# Patient Record
Sex: Male | Born: 1980
Health system: Southern US, Community
[De-identification: ages and names within clinical notes are randomized; demographics above are authoritative.]

## PROBLEM LIST (undated history)

## (undated) DIAGNOSIS — F959 Tic disorder, unspecified: Secondary | ICD-10-CM

## (undated) DIAGNOSIS — F988 Other specified behavioral and emotional disorders with onset usually occurring in childhood and adolescence: Secondary | ICD-10-CM

## (undated) HISTORY — DX: Tic disorder, unspecified: F95.9

## (undated) HISTORY — PX: MRI: SHX5353

## (undated) HISTORY — PX: HERNIA REPAIR: SHX51

## (undated) HISTORY — DX: Other specified behavioral and emotional disorders with onset usually occurring in childhood and adolescence: F98.8

---

## 2002-06-05 HISTORY — PX: TYMPANOSTOMY TUBE PLACEMENT: SHX32

## 2003-06-06 HISTORY — PX: COLONOSCOPY: SHX174

## 2004-06-23 ENCOUNTER — Ambulatory Visit: Payer: Self-pay | Admitting: Internal Medicine

## 2005-06-22 ENCOUNTER — Ambulatory Visit: Payer: Self-pay | Admitting: Internal Medicine

## 2005-06-27 ENCOUNTER — Encounter: Admission: RE | Admit: 2005-06-27 | Discharge: 2005-06-27 | Payer: Self-pay | Admitting: Internal Medicine

## 2005-07-20 ENCOUNTER — Encounter: Admission: RE | Admit: 2005-07-20 | Discharge: 2005-07-20 | Payer: Self-pay | Admitting: General Surgery

## 2005-09-15 ENCOUNTER — Ambulatory Visit: Payer: Self-pay | Admitting: Family Medicine

## 2006-01-31 ENCOUNTER — Ambulatory Visit (HOSPITAL_COMMUNITY): Admission: RE | Admit: 2006-01-31 | Discharge: 2006-01-31 | Payer: Self-pay | Admitting: General Surgery

## 2006-01-31 ENCOUNTER — Encounter (INDEPENDENT_AMBULATORY_CARE_PROVIDER_SITE_OTHER): Payer: Self-pay | Admitting: Specialist

## 2006-05-21 ENCOUNTER — Ambulatory Visit: Payer: Self-pay | Admitting: Internal Medicine

## 2006-11-06 ENCOUNTER — Telehealth (INDEPENDENT_AMBULATORY_CARE_PROVIDER_SITE_OTHER): Payer: Self-pay | Admitting: *Deleted

## 2006-11-08 DIAGNOSIS — F988 Other specified behavioral and emotional disorders with onset usually occurring in childhood and adolescence: Secondary | ICD-10-CM | POA: Insufficient documentation

## 2007-02-11 ENCOUNTER — Telehealth (INDEPENDENT_AMBULATORY_CARE_PROVIDER_SITE_OTHER): Payer: Self-pay | Admitting: *Deleted

## 2007-03-12 ENCOUNTER — Telehealth (INDEPENDENT_AMBULATORY_CARE_PROVIDER_SITE_OTHER): Payer: Self-pay | Admitting: *Deleted

## 2007-04-08 ENCOUNTER — Ambulatory Visit: Payer: Self-pay | Admitting: Internal Medicine

## 2007-04-08 DIAGNOSIS — F959 Tic disorder, unspecified: Secondary | ICD-10-CM

## 2007-04-24 ENCOUNTER — Telehealth (INDEPENDENT_AMBULATORY_CARE_PROVIDER_SITE_OTHER): Payer: Self-pay | Admitting: *Deleted

## 2007-08-06 ENCOUNTER — Telehealth (INDEPENDENT_AMBULATORY_CARE_PROVIDER_SITE_OTHER): Payer: Self-pay | Admitting: *Deleted

## 2007-09-20 ENCOUNTER — Telehealth (INDEPENDENT_AMBULATORY_CARE_PROVIDER_SITE_OTHER): Payer: Self-pay | Admitting: *Deleted

## 2007-10-01 ENCOUNTER — Encounter: Payer: Self-pay | Admitting: Internal Medicine

## 2007-10-01 ENCOUNTER — Telehealth (INDEPENDENT_AMBULATORY_CARE_PROVIDER_SITE_OTHER): Payer: Self-pay | Admitting: *Deleted

## 2007-12-26 ENCOUNTER — Telehealth (INDEPENDENT_AMBULATORY_CARE_PROVIDER_SITE_OTHER): Payer: Self-pay | Admitting: *Deleted

## 2008-03-23 ENCOUNTER — Encounter: Payer: Self-pay | Admitting: Internal Medicine

## 2008-04-06 ENCOUNTER — Ambulatory Visit: Payer: Self-pay | Admitting: Internal Medicine

## 2008-07-21 ENCOUNTER — Telehealth (INDEPENDENT_AMBULATORY_CARE_PROVIDER_SITE_OTHER): Payer: Self-pay | Admitting: *Deleted

## 2008-10-30 ENCOUNTER — Telehealth (INDEPENDENT_AMBULATORY_CARE_PROVIDER_SITE_OTHER): Payer: Self-pay | Admitting: *Deleted

## 2008-12-02 ENCOUNTER — Ambulatory Visit: Payer: Self-pay | Admitting: Internal Medicine

## 2009-01-25 ENCOUNTER — Telehealth (INDEPENDENT_AMBULATORY_CARE_PROVIDER_SITE_OTHER): Payer: Self-pay | Admitting: *Deleted

## 2009-04-19 ENCOUNTER — Ambulatory Visit: Payer: Self-pay | Admitting: Internal Medicine

## 2009-07-13 ENCOUNTER — Telehealth (INDEPENDENT_AMBULATORY_CARE_PROVIDER_SITE_OTHER): Payer: Self-pay | Admitting: *Deleted

## 2009-07-30 ENCOUNTER — Encounter (INDEPENDENT_AMBULATORY_CARE_PROVIDER_SITE_OTHER): Payer: Self-pay | Admitting: *Deleted

## 2009-07-30 ENCOUNTER — Ambulatory Visit: Payer: Self-pay | Admitting: Internal Medicine

## 2009-10-07 ENCOUNTER — Telehealth (INDEPENDENT_AMBULATORY_CARE_PROVIDER_SITE_OTHER): Payer: Self-pay | Admitting: *Deleted

## 2009-10-08 ENCOUNTER — Encounter: Payer: Self-pay | Admitting: Internal Medicine

## 2009-12-30 ENCOUNTER — Telehealth: Payer: Self-pay | Admitting: Internal Medicine

## 2010-01-05 ENCOUNTER — Ambulatory Visit: Payer: Self-pay | Admitting: Internal Medicine

## 2010-04-18 ENCOUNTER — Telehealth: Payer: Self-pay | Admitting: Internal Medicine

## 2010-07-05 NOTE — Assessment & Plan Note (Signed)
Summary: followup on adderall//kn   Vital Signs:  Patient profile:   30 year old male Height:      73 inches Weight:      255.13 pounds BMI:     33.78 Pulse rate:   88 / minute Pulse rhythm:   regular BP sitting:   118 / 84  (left arm) Cuff size:   large  Vitals Entered By: Army Fossa CMA (January 05, 2010 11:16 AM) CC: Pt here to f/u on Adderall, not fasting   History of Present Illness: next refill on Adderall Take it as needed, once daily. Symptoms well controlled   Current Medications (verified): 1)  Adderall Xr 10 Mg Cp24 (Amphetamine-Dextroamphetamine) .... Two P.o. Daily  Allergies: 1)  ! Amoxicillin 2)  ! Advil  Past History:  Past Medical History: Reviewed history from 04/06/2008 and no changes required. Chronic Tic Disorder ADD  Past Surgical History: Reviewed history from 04/03/2007 and no changes required. 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 Psych:  Denies anxiety and depression; sleeps well .  Physical Exam  General:  alert and well-developed.   Lungs:  normal respiratory effort, no intercostal retractions, no accessory muscle use, and normal breath sounds.   Heart:  normal rate, regular rhythm, and no murmur.   Psych:  Cognition and judgment appear intact. Alert and cooperative with normal attention span and concentration.  not anxious appearing and not depressed appearing.     Impression & Recommendations:  Problem # 1:  ADD (ICD-314.00) doing well Refill medicines Call for more refills as needed  Problem # 2:  f/u 1 year, rec also a CPX at his convinience  Complete Medication List: 1)  Adderall Xr 10 Mg Cp24 (Amphetamine-dextroamphetamine)  .... Two by mouth once daily as needed  Anticoagulation Management Assessment/Plan:            Patient Instructions: 1)  Please schedule a follow-up appointment in 1 year.  Prescriptions: ADDERALL XR 10 MG CP24 (AMPHETAMINE-DEXTROAMPHETAMINE) two by mouth once daily as needed  #60 x 0   Entered and Authorized by:   Elita Quick E. Paz MD   Signed by:   Nolon Rod. Paz MD on 01/05/2010   Method used:   Print then Give to Patient   RxID:   [id number]

## 2010-07-05 NOTE — Medication Information (Signed)
Summary: Prior Authorization & Approval for Adderall/[employer]  Prior Authorization & Approval for Adderall/[employer]   Imported By: Lanelle Bal 10/13/2009 12:59:03  _____________________________________________________________________  External Attachment:    Type:   Image     Comment:   External Document

## 2010-07-05 NOTE — Letter (Signed)
Summary: [facility] No Show Letter  [facility]  [address] [place], [place] [address]   Phone: [phone]  Fax: [phone]    07/30/2009 MRN: [id number]  Bertie Saetern [address] [place], [place]  [address]   Dear Mr. Haworth,   Our records indicate that you missed your scheduled appointment with ______Dr.Paz____________ on _____2/25/11_______.  Please contact this office to reschedule your appointment as soon as possible.  It is important that you keep your scheduled appointments with your physician, so we can provide you the best care possible.  Please be advised that there may be a charge for "no show" appointments.    Sincerely,   [facility]

## 2010-07-05 NOTE — Progress Notes (Signed)
Summary: Adderall  Phone Note Refill Request Message from:  Patient on April 18, 2010 4:36 PM  Refills Requested: Medication #1:  ADDERALL XR 10 MG CP24 two by mouth once daily as needed. I advised patient of the shortage and he will check with his pharmacy. He notes that he has previously tried Ritalin and did not do well with it.  Initial call taken by: Lucious Groves CMA,  April 18, 2010 4:36 PM  Follow-up for Phone Call        Patient notes that his pharmacy has #60 of the XR dose. Ok to refill? Lucious Groves CMA  April 18, 2010 4:41 PM   Additional Follow-up for Phone Call Additional follow up Details #1::        ok 60, no RF Jose E. Paz MD  April 18, 2010 5:15 PM     Additional Follow-up for Phone Call Additional follow up Details #2::    Patient was made aware yesterday that he can pick up the prescription after lunch today. Follow-up by: Lucious Groves CMA,  April 19, 2010 8:20 AM  Prescriptions: ADDERALL XR 10 MG CP24 (AMPHETAMINE-DEXTROAMPHETAMINE) two by mouth once daily as needed  #60 x 0   Entered by:   Lucious Groves CMA   Authorized by:   Nolon Rod. Paz MD   Signed by:   Lucious Groves CMA on 04/19/2010   Method used:   Print then Give to Patient   RxID:   [id number]

## 2010-07-05 NOTE — Progress Notes (Signed)
Summary: adderall refill  Phone Note Refill Request Call back at Work Phone [phone] Message from:  Patient on July 13, 2009 1:29 PM  Refills Requested: Medication #1:  ADDERALL XR 10 MG CP24 two p.o. daily Initial call taken by: Doristine Devoid,  July 13, 2009 1:29 PM  Follow-up for Phone Call        has pending appt on 07/30/09 and left msg prescription ready for pick up.........Marland KitchenDoristine Devoid  July 13, 2009 1:29 PM     Prescriptions: ADDERALL XR 10 MG CP24 (AMPHETAMINE-DEXTROAMPHETAMINE) two p.o. daily  #60 x 0   Entered by:   Doristine Devoid   Authorized by:   Nolon Rod. Paz MD   Signed by:   Doristine Devoid on 07/13/2009   Method used:   Print then Give to Patient   RxID:   [id number]

## 2010-07-05 NOTE — Progress Notes (Signed)
Summary: Adderall  Phone Note Refill Request Message from:  Patient on December 30, 2009 3:18 PM  Refills Requested: Medication #1:  ADDERALL XR 10 MG CP24 two p.o. daily patient will pick up [id number] after 12:00  Initial call taken by: Okey Regal Spring,  December 30, 2009 3:19 PM  Follow-up for Phone Call        please advise. Lucious Groves CMA  December 30, 2009 3:36 PM   Additional Follow-up for Phone Call Additional follow up Details #1::        no refill at this time.  pt has not seen Dr Drue Novel in >1 yr.  needs OV prior to refill Additional Follow-up by: Neena Rhymes MD,  December 30, 2009 3:40 PM    Additional Follow-up for Phone Call Additional follow up Details #2::    left message on voicemail to call back to office. Lucious Groves CMA  December 30, 2009 3:51 PM   [email or web address].Marland KitchenMarland KitchenHarold Barban  January 04, 2010 1:06 PM  Closed phone note, patient has appt today. Lucious Groves CMA  January 05, 2010 4:37 PM

## 2010-07-05 NOTE — Progress Notes (Signed)
Summary: prior auth iAPPROVED  for Adderall  Phone Note Refill Request Message from:  Fax from Pharmacy on Oct 07, 2009 2:00 PM  Refills Requested: Medication #1:  ADDERALL XR 10 MG CP24 two p.o. daily prior auth  request pt id [id number]--- ph [phone] ins [facility]   Initial call taken by: Okey Regal Spring,  Oct 07, 2009 2:01 PM  Follow-up for Phone Call        Prior auth in process for Adderall/[employer]  [phone] .Kandice Hams  Oct 08, 2009 8:32 AM   Additional Follow-up for Phone Call Additional follow up Details #1::        Prior auth approved for Adderall until 10/08/2010 .Kandice Hams  Oct 08, 2009 11:19 AM

## 2010-07-21 ENCOUNTER — Telehealth: Payer: Self-pay | Admitting: Internal Medicine

## 2010-07-27 NOTE — Progress Notes (Signed)
Summary: Refill--Adderall  Phone Note Refill Request Call back at Work Phone [phone] Message from:  Patient on July 21, 2010 2:08 PM  Refills Requested: Medication #1:  ADDERALL XR 10 MG CP24 two by mouth once daily as needed. Patient aware prescription will be ready after lunch tomorrow.  Initial call taken by: Lucious Groves CMA,  July 21, 2010 2:08 PM    Prescriptions: ADDERALL XR 10 MG CP24 (AMPHETAMINE-DEXTROAMPHETAMINE) two by mouth once daily as needed  #60 x 0   Entered by:   Lucious Groves CMA   Authorized by:   Nolon Rod. Audie Wieser MD   Signed by:   Lucious Groves CMA on 07/21/2010   Method used:   Print then Give to Patient   RxID:   [id number]

## 2010-10-21 NOTE — Op Note (Signed)
NAME:  David Buckley, David Buckley              ACCOUNT NO.:  [id number]   MEDICAL RECORD NO.:  [id number]          PATIENT TYPE:  AMB   LOCATION:  DAY                          FACILITY:  [facility]   PHYSICIAN:  Ollen Gross. Vernell Morgans, M.D. DATE OF BIRTH:  09-07-1980   DATE OF PROCEDURE:  01/31/2006  DATE OF DISCHARGE:  01/31/2006                                 OPERATIVE REPORT   PREOPERATIVE DIAGNOSIS:  Left inguinal hernia.   POSTOPERATIVE DIAGNOSIS:  Indirect left inguinal hernia.   PROCEDURE:  Left inguinal hernia repair with mesh.   SURGEON:  Ollen Gross. Carolynne Edouard, M.D.   ANESTHESIA:  General.   PROCEDURE:  After informed consent was obtained, the patient was brought to  the operating room and placed in the supine position on the operating table.  After adequate induction of general anesthesia, the patient's left groin and  abdomen were prepped with Betadine and draped in the usual sterile manner.  The area of the left groin was then infiltrated with 0.25% Marcaine with  epinephrine.  A small incision was made from the edge of the pubic tubercle  on the left towards the anterosuperior iliac spine for a distance of about 5  cm.  This incision was carried down through the skin and subcutaneous tissue  sharply with the electrocautery.  A small bridging vein was encountered that  was clamped with hemostats, divided and ligated with 3-0 silk ties.  The  rest of the dissection was carried sharply through the subcutaneous tissue  with electrocautery until the fascia of the external oblique was  encountered.  The fascia of the external oblique was opened along its fibers  using a 15 blade knife and Metzenbaum scissors towards the apex of the  external ring.  A Weitlaner retractor was deployed.  The cord structures  were then dissected bluntly with finger dissection at the edge of the pubic  tubercle until the cord structures could be surrounded between 2 fingers.  A  half-inch Penrose drain was then placed  around the cord structures for  retraction purposes.  The floor of the canal appeared to be intact.  The  cord structures were gently skeletonized by a combination of blunt hemostat  dissection and sharp dissection with the electrocautery.  An indirect hernia  sac was identified.  The sac appeared to be more of a sliding hernia and the  contents would not fully reduce out of the sac; therefore, the sac was  reduced and the opening around the cord was repaired with the 0 Vicryl  suture.  Once this was accomplished, a piece of 3 x 6 ULTRAPRO mesh was  chosen and cut to fit and the mesh was sewn inferiorly to the shelving edge  of inguinal ligament with a running 2-0 Prolene stitch.  Tails were cut in  the mesh and the tails of mesh were wrapped laterally around the cord  structures.  Superiorly, the mesh was sewn to the musculoaponeurotic  strength layer of the transversalis with interrupted 2-0 Prolene vertical  mattress stitches.  The tails were anchored laterally with an interrupted 2-  0  Prolene stitch to the shelving edge of the inguinal ligament.  Once this  was accomplished, the mesh was in good position without any tension.  The  wound was irrigated with copious amounts of saline.  The external oblique  was then reapproximated with a running 2-0 Vicryl stitch.  The wound was  then infiltrated with more 0.25% Marcaine with epinephrine.  The  subcutaneous fascia was closed with a running 3-0 Vicryl stitch and the skin  was closed with a running 4-0 Monocryl subcuticular stitch.  Benzoin, Steri-  Strips and sterile dressings were applied.  The patient's testicle was in the scrotum at the end of the case.  The  patient tolerated well.  At the end of the case, all needle, sponge and  instrument counts were correct.  The patient was then awakened and taken to  the recovery room in stable condition.      Ollen Gross. Vernell Morgans, M.D.  Electronically Signed     PST/MEDQ  D:  02/06/2006  T:   02/06/2006  Job:  [id number]

## 2010-10-24 ENCOUNTER — Other Ambulatory Visit: Payer: Self-pay

## 2010-10-24 MED ORDER — AMPHETAMINE-DEXTROAMPHET ER 10 MG PO CP24: 10.0000 mg | ORAL_CAPSULE | Freq: Every day | ORAL | Status: DC

## 2010-10-24 NOTE — Telephone Encounter (Signed)
Pt aware that office is due before next refill.

## 2010-12-28 ENCOUNTER — Other Ambulatory Visit: Payer: Self-pay

## 2010-12-28 MED ORDER — AMPHETAMINE-DEXTROAMPHET ER 10 MG PO CP24: 10.0000 mg | ORAL_CAPSULE | Freq: Every day | ORAL | Status: DC

## 2010-12-28 NOTE — Telephone Encounter (Signed)
RX at the front for pick-up, patient aware appointment due 01/2011

## 2011-01-24 ENCOUNTER — Telehealth: Payer: Self-pay | Admitting: *Deleted

## 2011-01-24 ENCOUNTER — Encounter: Payer: Self-pay | Admitting: *Deleted

## 2011-01-24 ENCOUNTER — Ambulatory Visit (INDEPENDENT_AMBULATORY_CARE_PROVIDER_SITE_OTHER): Payer: 59 | Admitting: Internal Medicine

## 2011-01-24 VITALS — BP 122/70 | HR 75 | Temp 98.7°F | Resp 14 | Wt 263.5 lb

## 2011-01-24 DIAGNOSIS — F988 Other specified behavioral and emotional disorders with onset usually occurring in childhood and adolescence: Secondary | ICD-10-CM

## 2011-01-24 DIAGNOSIS — E669 Obesity, unspecified: Secondary | ICD-10-CM

## 2011-01-24 DIAGNOSIS — M79606 Pain in leg, unspecified: Secondary | ICD-10-CM

## 2011-01-24 DIAGNOSIS — F959 Tic disorder, unspecified: Secondary | ICD-10-CM

## 2011-01-24 DIAGNOSIS — M79609 Pain in unspecified limb: Secondary | ICD-10-CM

## 2011-01-24 LAB — BASIC METABOLIC PANEL
BUN: 17 mg/dL (ref 6–23)
CO2: 30 mEq/L (ref 19–32)
Creatinine, Ser: 0.9 mg/dL (ref 0.4–1.5)
GFR: 106.99 mL/min (ref >60.00)
Glucose, Bld: 91 mg/dL (ref 70–99)

## 2011-01-24 LAB — TSH: TSH: 1.94 u[IU]/mL (ref 0.35–5.50)

## 2011-01-24 MED ORDER — AMPHETAMINE-DEXTROAMPHET ER 10 MG PO CP24: 10.0000 mg | ORAL_CAPSULE | Freq: Every day | ORAL | Status: DC

## 2011-01-24 NOTE — Progress Notes (Signed)
  Subjective:    Patient ID: David Buckley, male    DOB: 18-Aug-1980, 30 y.o.   MRN: [id number]  HPI ROV ADHD: Symptoms well controlled with current doses of Adderall Leg pain: Complain of left calf cramp seems he had a back problem a few years ago, he noted that the cramps were more noticeable this week when, incidentally he couldn't do as much exercise as he usually does. Obesity: Elevated BMI, states he's eating 1100 calories a day and runs 2.5 miles 5 times a week, despite that regimen he is unable to lose weight  Past Medical History  Diagnosis Date  . Tic disorder     Chronic  . ADD (attention deficit disorder)    Past Surgical History  Procedure Date  . Mri     L5-S1 problem---recvd nerve root block, denied Sx   History   Social History  . Marital Status: Married    Spouse Name: N/A    Number of Children: 0  . Years of Education: N/A   Occupational History  . commercial real estate      Social History Main Topics  . Smoking status: Never Smoker   . Smokeless tobacco: Not on file  . Alcohol Use: Yes     socially  . Drug Use: No  . Sexually Active: Not on file   Other Topics Concern  . Not on file   Social History Narrative   Works FT w/his fatherWife pregnant again (has miscarriage x 2)Owns a Holiday representative business (PT)    Review of Systems Denies anxiety, depression, insomnia. Denies any lower extremity swelling or rash. No back pain. Some distress, wife had 2 miscarriages, she is pregnant again.    Objective:   Physical Exam  Constitutional: He is oriented to person, place, and time. He appears well-developed and well-nourished.  HENT:  Head: Normocephalic and atraumatic.  Musculoskeletal:       Calves symmetric, no tender   Neurological: He is alert and oriented to person, place, and time. He has normal reflexes. He displays normal reflexes. He exhibits normal muscle tone. Coordination normal.       Straight leg test negative  Skin: Skin is warm  and dry.  Psychiatric: He has a normal mood and affect. His behavior is normal. Judgment and thought content normal.          Assessment & Plan:

## 2011-01-24 NOTE — Assessment & Plan Note (Signed)
Doing well, RF, RTC 6 months

## 2011-01-24 NOTE — Telephone Encounter (Signed)
Same Day Abstraction. 

## 2011-01-24 NOTE — Assessment & Plan Note (Signed)
BMI 34, unable to loose wt despite exercising x 5 /week and eating better Plan: Labs [employer]? Calorie counting

## 2011-01-24 NOTE — Assessment & Plan Note (Signed)
Chronic L calf cramp since back problems few years ago, exam normal rec observation, call if sx increase

## 2011-01-24 NOTE — Assessment & Plan Note (Signed)
Used to see neurology, used to take cylert

## 2011-05-02 ENCOUNTER — Other Ambulatory Visit: Payer: Self-pay | Admitting: Internal Medicine

## 2011-05-04 ENCOUNTER — Other Ambulatory Visit: Payer: Self-pay

## 2011-05-04 MED ORDER — AMPHETAMINE-DEXTROAMPHET ER 10 MG PO CP24: 10.0000 mg | ORAL_CAPSULE | Freq: Every day | ORAL | Status: DC

## 2011-05-04 NOTE — Telephone Encounter (Signed)
Done and pt aware

## 2011-05-04 NOTE — Telephone Encounter (Signed)
Rx printed and ready for pick up. Pt aware

## 2011-07-04 ENCOUNTER — Telehealth: Payer: Self-pay | Admitting: Internal Medicine

## 2011-07-04 MED ORDER — AMPHETAMINE-DEXTROAMPHET ER 10 MG PO CP24: 10.0000 mg | ORAL_CAPSULE | Freq: Every day | ORAL | Status: DC

## 2011-07-04 NOTE — Telephone Encounter (Signed)
Yes please

## 2011-07-04 NOTE — Telephone Encounter (Signed)
Refill request Adderall 10mg . #30 with zero refills. Last refilled on 11.29.13. OK to refill?

## 2011-07-04 NOTE — Telephone Encounter (Signed)
Patient is requesting a new prescription for adderall °

## 2011-07-04 NOTE — Telephone Encounter (Signed)
Refill done.  

## 2011-08-18 ENCOUNTER — Other Ambulatory Visit: Payer: Self-pay | Admitting: *Deleted

## 2011-08-18 MED ORDER — AMPHETAMINE-DEXTROAMPHET ER 10 MG PO CP24: 10.0000 mg | ORAL_CAPSULE | Freq: Every day | ORAL | Status: DC

## 2011-08-18 NOTE — Telephone Encounter (Signed)
Pt aware Rx ready for pick up 

## 2011-08-18 NOTE — Telephone Encounter (Signed)
Office Message [address] [place], [place] [address] p. [phone] f. [phone] To: [facility] (Daytime Triage) Fax: [phone] From: [facility] Date/ Time: 08/18/2011 9:24 AM Taken By: Thayer Headings, RN Caller: Molli Hazard Facility: not collected Patient: David Buckley, David Buckley DOB: 1981-05-06 Phone: [phone] Reason for Call: Pt calling today 08/17/11 regarding needs prescription refill for Adderal XR10 mg QD, says he has to talk with MD's nurse each time to get prescription refilled. PLEASE CALL PT AT [phone] WHEN HE CAN COME BY TO PICK UP PRESCRIPTION. Regarding Appointment: Appt Date: Appt Time: Unknown Provider: Reason: Details: Outcome:

## 2011-10-18 ENCOUNTER — Other Ambulatory Visit: Payer: Self-pay | Admitting: *Deleted

## 2011-10-18 MED ORDER — AMPHETAMINE-DEXTROAMPHET ER 10 MG PO CP24: 10.0000 mg | ORAL_CAPSULE | Freq: Every day | ORAL | Status: DC

## 2011-10-18 NOTE — Telephone Encounter (Signed)
Pt aware 6 month f/u due and Rx ready for pickup, appt scheduled.

## 2011-11-06 ENCOUNTER — Ambulatory Visit (INDEPENDENT_AMBULATORY_CARE_PROVIDER_SITE_OTHER): Payer: 59 | Admitting: Internal Medicine

## 2011-11-06 ENCOUNTER — Encounter: Payer: Self-pay | Admitting: Internal Medicine

## 2011-11-06 VITALS — BP 130/86 | HR 82 | Temp 98.2°F | Wt 267.0 lb

## 2011-11-06 DIAGNOSIS — F988 Other specified behavioral and emotional disorders with onset usually occurring in childhood and adolescence: Secondary | ICD-10-CM

## 2011-11-06 NOTE — Patient Instructions (Signed)
Call for refills as needed  Come back in 6 months Physical exam at your convenience

## 2011-11-06 NOTE — Progress Notes (Signed)
  Subjective:    Patient ID: David Buckley, male    DOB: 04-Mar-1981, 31 y.o.   MRN: [id number]  HPI ROV Doing well, adderall at present dose works well Has a 36 week old baby at home !  Past Medical History  Diagnosis Date  . Tic disorder     Chronic  . ADD (attention deficit disorder)    Past Surgical History  Procedure Date  . Mri     L5-S1 problem---recvd nerve root block, denied Sx     Review of Systems No anxiety depression, sleeps well  No CP, SOB No N V D    Objective:   Physical Exam  General -- alert, well-developed, and overweight appearing. No apparent distress.  Lungs -- normal respiratory effort, no intercostal retractions, no accessory muscle use, and normal breath sounds.   Heart-- normal rate, regular rhythm, no murmur, and no gallop.   Neurologic-- alert & oriented X3 and strength normal in all extremities. Psych-- Cognition and judgment appear intact. Alert and cooperative with normal attention span and concentration.  not anxious appearing and not depressed appearing.       Assessment & Plan:

## 2011-11-06 NOTE — Assessment & Plan Note (Signed)
Doing well RF as needed Recommend to to RTC ~ 6 months, due for CPX

## 2012-01-03 ENCOUNTER — Other Ambulatory Visit: Payer: Self-pay | Admitting: *Deleted

## 2012-01-03 MED ORDER — AMPHETAMINE-DEXTROAMPHET ER 10 MG PO CP24: 10.0000 mg | ORAL_CAPSULE | Freq: Every day | ORAL | Status: DC

## 2012-01-03 NOTE — Telephone Encounter (Signed)
Pt aware Rx ready for pick up 

## 2012-02-21 ENCOUNTER — Telehealth: Payer: Self-pay | Admitting: Internal Medicine

## 2012-02-21 MED ORDER — AMPHETAMINE-DEXTROAMPHET ER 10 MG PO CP24: 10.0000 mg | ORAL_CAPSULE | Freq: Every day | ORAL | Status: DC

## 2012-02-21 NOTE — Telephone Encounter (Signed)
Ok  #30 

## 2012-02-21 NOTE — Telephone Encounter (Signed)
Ok to refill 

## 2012-02-21 NOTE — Telephone Encounter (Signed)
LM ON TRIAGE LINE 341pm needs refill for Adderall CB# [phone]

## 2012-02-21 NOTE — Telephone Encounter (Signed)
Refill done.  

## 2012-05-07 ENCOUNTER — Other Ambulatory Visit: Payer: Self-pay | Admitting: *Deleted

## 2012-05-07 MED ORDER — AMPHETAMINE-DEXTROAMPHET ER 10 MG PO CP24: 10.0000 mg | ORAL_CAPSULE | Freq: Every day | ORAL | Status: DC

## 2012-05-07 NOTE — Telephone Encounter (Signed)
Rx ready for pick up. 

## 2012-05-21 ENCOUNTER — Encounter: Payer: Self-pay | Admitting: Internal Medicine

## 2012-05-21 ENCOUNTER — Ambulatory Visit (INDEPENDENT_AMBULATORY_CARE_PROVIDER_SITE_OTHER): Payer: 59 | Admitting: Internal Medicine

## 2012-05-21 VITALS — BP 122/84 | HR 89 | Temp 97.9°F | Wt 258.0 lb

## 2012-05-21 DIAGNOSIS — B9789 Other viral agents as the cause of diseases classified elsewhere: Secondary | ICD-10-CM

## 2012-05-21 DIAGNOSIS — B349 Viral infection, unspecified: Secondary | ICD-10-CM

## 2012-05-21 DIAGNOSIS — J029 Acute pharyngitis, unspecified: Secondary | ICD-10-CM

## 2012-05-21 LAB — POCT INFLUENZA A/B
Influenza A, POC: NEGATIVE
Influenza B, POC: NEGATIVE

## 2012-05-21 MED ORDER — AZITHROMYCIN 250 MG PO TABS: ORAL_TABLET | ORAL | Status: DC

## 2012-05-21 NOTE — Progress Notes (Signed)
  Subjective:    Patient ID: David Buckley, male    DOB: 1980-10-15, 31 y.o.   MRN: [id number]  HPI Acute visit One-day history of aches, mostly @ his back;  moderate to severe sore throat, a lot of mucus accumulation in the throat, mild sinus congestion.  Past Medical History  Diagnosis Date  . Tic disorder     Chronic  . ADD (attention deficit disorder)    Past Surgical History  Procedure Date  . Mri     L5-S1 problem---recvd nerve root block, denied Sx   History   Social History  . Marital Status: Married    Spouse Name: N/A    Number of Children: 1  . Years of Education: N/A   Occupational History  . commercial real estate      Social History Main Topics  . Smoking status: Never Smoker   . Smokeless tobacco: Not on file  . Alcohol Use: Yes     Comment: socially  . Drug Use: No  . Sexually Active: Not on file   Other Topics Concern  . Not on file   Social History Narrative       Review of Systems No fever chills, very mild cough, no chest congestion. Denies any nausea vomiting, had diarrhea today, denies blood in the stools or abdominal pain.     Objective:   Physical Exam  General -- alert, well-developed , NAD   Neck --no  LADs HEENT -- TMs: I see ear tubes bilaterally, no discharge; throat + redness, tonsils normal size, no discharge; face symmetric and not tender to palpation, mild nasal congestion Lungs -- normal respiratory effort, no intercostal retractions, no accessory muscle use, and normal breath sounds.   Heart-- normal rate, regular rhythm, no murmur, and no gallop.    Psych-- Cognition and judgment appear intact. Alert and cooperative with normal attention span and concentration.  not anxious appearing and not depressed appearing.       Assessment & Plan:  Viral syndrome, Strep and  flu testing negative. See instructions

## 2012-05-21 NOTE — Patient Instructions (Signed)
Rest, fluids , tylenol If  cough, take Mucinex DM twice a day as needed   Take the antibiotic as prescribed  (zithromax) if not improving in the next couple of days Call if no better by next week Call anytime if the symptoms are severe

## 2012-06-10 ENCOUNTER — Encounter: Payer: Self-pay | Admitting: *Deleted

## 2012-06-11 ENCOUNTER — Ambulatory Visit (INDEPENDENT_AMBULATORY_CARE_PROVIDER_SITE_OTHER): Payer: 59 | Admitting: Internal Medicine

## 2012-06-11 ENCOUNTER — Encounter: Payer: Self-pay | Admitting: Internal Medicine

## 2012-06-11 VITALS — BP 132/76 | HR 87 | Temp 97.7°F | Wt 263.0 lb

## 2012-06-11 DIAGNOSIS — F988 Other specified behavioral and emotional disorders with onset usually occurring in childhood and adolescence: Secondary | ICD-10-CM

## 2012-06-11 DIAGNOSIS — J069 Acute upper respiratory infection, unspecified: Secondary | ICD-10-CM

## 2012-06-11 DIAGNOSIS — Z23 Encounter for immunization: Secondary | ICD-10-CM

## 2012-06-11 MED ORDER — AMPHETAMINE-DEXTROAMPHET ER 10 MG PO CP24: 10.0000 mg | ORAL_CAPSULE | Freq: Every day | ORAL | Status: DC

## 2012-06-11 NOTE — Progress Notes (Signed)
  Subjective:    Patient ID: David Buckley, male    DOB: 11-Jul-1980, 32 y.o.   MRN: [id number]  HPI Routine office visit ADD, needs a refill, we also need to call his insurance company, see assessment and plan. Respiratory symptoms, was seen last month with a viral syndrome, he ended up taking a Z-Pak by December 30, got well up until 5 days ago when he developed sinus congestion, clear nasal discharge.  Past Medical History  Diagnosis Date  . Tic disorder     Chronic  . ADD (attention deficit disorder)    Past Surgical History  Procedure Date  . Mri     L5-S1 problem---recvd nerve root block, denied Sx   History   Social History  . Marital Status: Married    Spouse Name: N/A    Number of Children: 1  . Years of Education: N/A   Occupational History  . commercial real estate      Social History Main Topics  . Smoking status: Never Smoker   . Smokeless tobacco: Not on file  . Alcohol Use: Yes     Comment: socially  . Drug Use: No  . Sexually Active: Not on file   Other Topics Concern  . Not on file   Social History Narrative      Review of Systems Currently without fever, chills, nausea, vomiting. No myalgias. No actual cough or chest congestion.    Objective:   Physical Exam  General -- alert, well-developed  HEENT -- TMs normal, has B ear tubes; throat w/o redness, face symmetric and not tender to palpation, nose congested  Lungs -- normal respiratory effort, no intercostal retractions, no accessory muscle use, and normal breath sounds.   Heart-- normal rate, regular rhythm, no murmur, and no gallop.    Psych-- Cognition and judgment appear intact. Alert and cooperative with normal attention span and concentration.  not anxious appearing and not depressed appearing.      Assessment & Plan:   URI, Was seen with a viral syndrome last month, now w/ respiratory symptoms for 5 days, likely a URI. His daughter has the same symptoms. Plan: see instructions

## 2012-06-11 NOTE — Patient Instructions (Addendum)
Qnsal 2 sprays on each side of the nose until sample gone mucinex twice a day until better Call if nor back to normal in few days  --- Next visit in 6 months, physical exam

## 2012-06-11 NOTE — Assessment & Plan Note (Signed)
Diagnosed with ADD in elementary school, his insurance company is requesting a phone call from prior resection. Currently on Adderall XR, symptoms well-controlled, no apparent side effects. In the past, he tried ritalin but it caused tics In the past he also tried plain Adderall and another med, eventually was switch to adderall XR and doing well.

## 2012-06-13 ENCOUNTER — Telehealth: Payer: Self-pay | Admitting: Internal Medicine

## 2012-06-13 ENCOUNTER — Encounter: Payer: Self-pay | Admitting: Internal Medicine

## 2012-06-13 MED ORDER — AZITHROMYCIN 250 MG PO TABS: ORAL_TABLET | ORAL | Status: DC

## 2012-06-13 NOTE — Telephone Encounter (Signed)
Re-send the Zithromax Rx to his pharmacy

## 2012-06-13 NOTE — Telephone Encounter (Signed)
Pt called stating that since Tuesday when he came in for an OV & was diagnosed with a URI pt states he is much worse, coughing more, still congested, & unable to sleep at night. Pt states that he was prescribed a z-pak but he did not have it filled & he's not sure where the prescription is. Pt would like another antibiotic called into [facility]. Please advise.

## 2012-06-13 NOTE — Telephone Encounter (Signed)
Sent zpak to pharmacy, pt made aware.

## 2012-09-02 ENCOUNTER — Telehealth: Payer: Self-pay | Admitting: *Deleted

## 2012-09-02 MED ORDER — AMPHETAMINE-DEXTROAMPHET ER 10 MG PO CP24: 10.0000 mg | ORAL_CAPSULE | Freq: Every day | ORAL | Status: DC

## 2012-09-02 NOTE — Telephone Encounter (Signed)
done

## 2012-09-02 NOTE — Telephone Encounter (Signed)
Pt made aware rx is ready to be picked up at front desk.  

## 2012-09-02 NOTE — Telephone Encounter (Signed)
Patient left message on triage requesting refill on Adderall. Last OV and refill both on 06/11/12 with #30 prescribed. Okay to refill?

## 2012-11-27 ENCOUNTER — Telehealth: Payer: Self-pay | Admitting: *Deleted

## 2012-11-27 MED ORDER — AMPHETAMINE-DEXTROAMPHET ER 10 MG PO CP24: 10.0000 mg | ORAL_CAPSULE | Freq: Every day | ORAL | Status: DC

## 2012-11-27 NOTE — Telephone Encounter (Signed)
Last OV 06-11-12, Last refilled 09-02-12 #30

## 2012-11-27 NOTE — Telephone Encounter (Signed)
Which med?

## 2012-11-27 NOTE — Telephone Encounter (Signed)
Ok for #30, no refills 

## 2012-11-27 NOTE — Telephone Encounter (Signed)
Pt aware Rx ready for pick up 

## 2012-11-27 NOTE — Telephone Encounter (Signed)
adderall

## 2012-12-07 ENCOUNTER — Ambulatory Visit (INDEPENDENT_AMBULATORY_CARE_PROVIDER_SITE_OTHER): Payer: 59 | Admitting: Family Medicine

## 2012-12-07 VITALS — BP 130/82 | HR 78 | Temp 98.0°F | Resp 18 | Ht 73.0 in | Wt 259.0 lb

## 2012-12-07 DIAGNOSIS — H6690 Otitis media, unspecified, unspecified ear: Secondary | ICD-10-CM

## 2012-12-07 DIAGNOSIS — H6692 Otitis media, unspecified, left ear: Secondary | ICD-10-CM

## 2012-12-07 DIAGNOSIS — H109 Unspecified conjunctivitis: Secondary | ICD-10-CM

## 2012-12-07 MED ORDER — OFLOXACIN 0.3 % OP SOLN: 1.0000 [drp] | Freq: Four times a day (QID) | OPHTHALMIC | Status: DC

## 2012-12-07 MED ORDER — AMOXICILLIN 500 MG PO TABS: 500.0000 mg | ORAL_TABLET | Freq: Two times a day (BID) | ORAL | Status: DC

## 2012-12-07 NOTE — Progress Notes (Signed)
[facility]:  Office Visit  Chief Complaint:  Chief Complaint  Patient presents with  . pink eye and ear pain    HPI: David Buckley is a 32 y.o. male who complains of  Last week sxs started after his daughter came home with pink eye. She also had ear infection and runny nose. He now has all those sxs. No fever or chills. He has tubes in his ear since 2005. No sore throat or facial pain. He tried erythromycin ointment in his eyes with some releif, this is his daughters, She is totally better after getting treated. The erythromycin make his eyes feels better, he has some bluured vision because of the ointment other than that the redness has improved with it. He deneis eye pain or swelling. Deneis allergies. Has been moving and has been exposed to dust but this was later than when his ear paina nd pink eye started. Now it is in both eyes. .   Past Medical History  Diagnosis Date  . Tic disorder     Chronic  . ADD (attention deficit disorder)    Past Surgical History  Procedure Laterality Date  . Mri      L5-S1 problem---recvd nerve root block, denied Sx  . Hernia repair     History   Social History  . Marital Status: Married    Spouse Name: N/A    Number of Children: 1  . Years of Education: N/A   Occupational History  . commercial real estate      Social History Main Topics  . Smoking status: Never Smoker   . Smokeless tobacco: None  . Alcohol Use: Yes     Comment: socially  . Drug Use: No  . Sexually Active: None   Other Topics Concern  . None   Social History Narrative                  Family History  Problem Relation Age of Onset  . Diabetes Other     GM  . Prostate cancer Neg Hx   . Colon cancer Neg Hx   . Heart attack Neg Hx    No Active Allergies Prior to Admission medications   Medication Sig Start Date End Date Taking? Authorizing Provider  amphetamine-dextroamphetamine (ADDERALL XR) 10 MG 24 hr capsule Take 1 capsule (10 mg  total) by mouth daily. 11/27/12 11/27/13 Yes Sheliah Hatch, MD  Multiple Vitamin (MULTIVITAMIN) tablet Take 1 tablet by mouth daily.     Yes Historical Provider, MD     ROS: The patient denies fevers, chills, night sweats, unintentional weight loss, chest pain, palpitations, wheezing, dyspnea on exertion, nausea, vomiting, abdominal pain, dysuria, hematuria, melena, numbness, weakness, or tingling.   All other systems have been reviewed and were otherwise negative with the exception of those mentioned in the HPI and as above.    PHYSICAL EXAM: Filed Vitals:   12/07/12 1036  BP: 130/82  Pulse: 78  Temp: 98 F (36.7 C)  Resp: 18   Filed Vitals:   12/07/12 1036  Height: 6\' 1"  (1.854 m)  Weight: 259 lb (117.482 kg)   Body mass index is 34.18 kg/(m^2).  General: Alert, no acute distress HEENT:  Normocephalic, atraumatic, oropharynx patent. + right conjunctivitis, + left  TM erythematous and boggy with minimal wet wax. No sinus pressure. There tubes in both ears.  Cardiovascular:  Regular rate and rhythm, no rubs murmurs or gallops.  No Carotid bruits, radial pulse  intact. No pedal edema.  Respiratory: Clear to auscultation bilaterally.  No wheezes, rales, or rhonchi.  No cyanosis, no use of accessory musculature GI: No organomegaly, abdomen is soft and non-tender, positive bowel sounds.  No masses. Skin: No rashes. Neurologic: Facial musculature symmetric. Psychiatric: Patient is appropriate throughout our interaction. Lymphatic: No cervical lymphadenopathy Musculoskeletal: Gait intact.   LABS: Results for orders placed in visit on 05/21/12  POCT RAPID STREP A (OFFICE)      Result Value Range   Rapid Strep A Screen Negative  Negative  POCT INFLUENZA A/B      Result Value Range   Influenza A, POC Negative     Influenza B, POC Negative       EKG/XRAY:   Primary read interpreted by Dr[facility] Conley Rolls at [facility].   ASSESSMENT/PLAN: Encounter Diagnoses  Name Primary?  . Otitis  media, left Yes  . Conjunctivitis    Rx Ocuflox for ? Bacterial conjunctivitis. Advise to avoid allergens, This may be allergy vs viral as well. He states he is not getting any better.  Rx Amoxacallin  Of rear infection.  F/u prn Handwashing precautions    Adalie Mand PHUONG, DO 12/07/2012 11:35 AM

## 2012-12-07 NOTE — Patient Instructions (Signed)
Allergic Conjunctivitis The conjunctiva is a thin membrane that covers the visible white part of the eyeball and the underside of the eyelids. This membrane protects and lubricates the eye. The membrane has small blood vessels running through it that can normally be seen. When the conjunctiva becomes inflamed, the condition is called conjunctivitis. In response to the inflammation, the conjunctival blood vessels become swollen. The swelling results in redness in the normally white part of the eye. The blood vessels of this membrane also react when a person has allergies and is then called allergic conjunctivitis. This condition usually lasts for as long as the allergy persists. Allergic conjunctivitis cannot be passed to another person (non-contagious). The likelihood of bacterial infection is great and the cause is not likely due to allergies if the inflamed eye has:  A sticky discharge.  Discharge or sticking together of the lids in the morning.  Scaling or flaking of the eyelids where the eyelashes come out.  Red swollen eyelids. CAUSES   Viruses.  Irritants such as foreign bodies.  Chemicals.  General allergic reactions.  Inflammation or serious diseases in the inside or the outside of the eye or the orbit (the boney cavity in which the eye sits) can cause a "red eye." SYMPTOMS   Eye redness.  Tearing.  Itchy eyes.  Burning feeling in the eyes.  Clear drainage from the eye.  Allergic reaction due to pollens or ragweed sensitivity. Seasonal allergic conjunctivitis is frequent in the spring when pollens are in the air and in the fall. DIAGNOSIS  This condition, in its many forms, is usually diagnosed based on the history and an ophthalmological exam. It usually involves both eyes. If your eyes react at the same time every year, allergies may be the cause. While most "red eyes" are due to allergy or an infection, the role of an eye (ophthalmological) exam is important. The exam  can rule out serious diseases of the eye or orbit. TREATMENT   Non-antibiotic eye drops, ointments, or medications by mouth may be prescribed if the ophthalmologist is sure the conjunctivitis is due to allergies alone.  Over-the-counter drops and ointments for allergic symptoms should be used only after other causes of conjunctivitis have been ruled out, or as your caregiver suggests. Medications by mouth are often prescribed if other allergy-related symptoms are present. If the ophthalmologist is sure that the conjunctivitis is due to allergies alone, treatment is normally limited to drops or ointments to reduce itching and burning. HOME CARE INSTRUCTIONS   Wash hands before and after applying drops or ointments, or touching the inflamed eye(s) or eyelids.  Do not let the eye dropper tip or ointment tube touch the eyelid when putting medicine in your eye.  Stop using your soft contact lenses and throw them away. Use a new pair of lenses when recovery is complete. You should run through sterilizing cycles at least three times before use after complete recovery if the old soft contact lenses are to be used. Hard contact lenses should be stopped. They need to be thoroughly sterilized before use after recovery.  Itching and burning eyes due to allergies is often relieved by using a cool cloth applied to closed eye(s). SEEK MEDICAL CARE IF:   Your problems do not go away after two or three days of treatment.  Your lids are sticky (especially in the morning when you wake up) or stick together.  Discharge develops. Antibiotics may be needed either as drops, ointment, or by mouth.  You   have extreme light sensitivity.  An oral temperature above 102 F (38.9 C) develops.  Pain in or around the eye or any other visual symptom develops. MAKE SURE YOU:   Understand these instructions.  Will watch your condition.  Will get help right away if you are not doing well or get worse. Document  Released: 08/12/2002 Document Revised: 08/14/2011 Document Reviewed: 07/08/2007 ExitCare Patient Information 2014 [employer]. Conjunctivitis Conjunctivitis is commonly called "pink eye." Conjunctivitis can be caused by bacterial or viral infection, allergies, or injuries. There is usually redness of the lining of the eye, itching, discomfort, and sometimes discharge. There may be deposits of matter along the eyelids. A viral infection usually causes a watery discharge, while a bacterial infection causes a yellowish, thick discharge. Pink eye is very contagious and spreads by direct contact. You may be given antibiotic eyedrops as part of your treatment. Before using your eye medicine, remove all drainage from the eye by washing gently with warm water and cotton balls. Continue to use the medication until you have awakened 2 mornings in a row without discharge from the eye. Do not rub your eye. This increases the irritation and helps spread infection. Use separate towels from other household members. Wash your hands with soap and water before and after touching your eyes. Use cold compresses to reduce pain and sunglasses to relieve irritation from light. Do not wear contact lenses or wear eye makeup until the infection is gone. SEEK MEDICAL CARE IF:   Your symptoms are not better after 3 days of treatment.  You have increased pain or trouble seeing.  The outer eyelids become very red or swollen. Document Released: 06/29/2004 Document Revised: 08/14/2011 Document Reviewed: 05/22/2005 ExitCare Patient Information 2014 [employer].  

## 2013-01-30 ENCOUNTER — Telehealth: Payer: Self-pay | Admitting: General Practice

## 2013-01-30 MED ORDER — AMPHETAMINE-DEXTROAMPHET ER 10 MG PO CP24: 10.0000 mg | ORAL_CAPSULE | Freq: Every day | ORAL | Status: DC

## 2013-01-30 NOTE — Telephone Encounter (Signed)
Adderall refill Last OV 06-11-12 Med last filled on 11-27-12 #30 with 0 refills.    NO UDS on file.

## 2013-01-30 NOTE — Telephone Encounter (Signed)
Pt.notified

## 2013-01-30 NOTE — Telephone Encounter (Signed)
Needs UDS at time of prescription pick up. Rx printed . Due for a OV, please arrange

## 2013-03-02 ENCOUNTER — Emergency Department (HOSPITAL_COMMUNITY)
Admission: EM | Admit: 2013-03-02 | Discharge: 2013-03-02 | Disposition: A | Discharge status: Home / Self Care | Payer: 59 | Attending: Emergency Medicine | Admitting: Emergency Medicine

## 2013-03-02 ENCOUNTER — Emergency Department (HOSPITAL_COMMUNITY): Payer: 59

## 2013-03-02 ENCOUNTER — Encounter (HOSPITAL_COMMUNITY): Payer: Self-pay | Admitting: Nurse Practitioner

## 2013-03-02 DIAGNOSIS — Y92009 Unspecified place in unspecified non-institutional (private) residence as the place of occurrence of the external cause: Secondary | ICD-10-CM | POA: Insufficient documentation

## 2013-03-02 DIAGNOSIS — Z8659 Personal history of other mental and behavioral disorders: Secondary | ICD-10-CM | POA: Insufficient documentation

## 2013-03-02 DIAGNOSIS — S6000XA Contusion of unspecified finger without damage to nail, initial encounter: Secondary | ICD-10-CM | POA: Insufficient documentation

## 2013-03-02 DIAGNOSIS — W278XXA Contact with other nonpowered hand tool, initial encounter: Secondary | ICD-10-CM | POA: Insufficient documentation

## 2013-03-02 DIAGNOSIS — Y9389 Activity, other specified: Secondary | ICD-10-CM | POA: Insufficient documentation

## 2013-03-02 DIAGNOSIS — S6990XA Unspecified injury of unspecified wrist, hand and finger(s), initial encounter: Secondary | ICD-10-CM | POA: Insufficient documentation

## 2013-03-02 DIAGNOSIS — S6991XA Unspecified injury of right wrist, hand and finger(s), initial encounter: Secondary | ICD-10-CM

## 2013-03-02 NOTE — ED Provider Notes (Signed)
Medical screening examination/treatment/procedure(s) were performed by non-physician practitioner and as supervising physician I was immediately available for consultation/collaboration.  Arbor Cohen R. Keniya Schlotterbeck, MD 03/02/13 2002 

## 2013-03-02 NOTE — ED Provider Notes (Signed)
CSN: [id number]     Arrival date & time 03/02/13  1133 History   First MD Initiated Contact with Patient 03/02/13 1140     Chief Complaint  Patient presents with  . Hand Pain   (Consider location/radiation/quality/duration/timing/severity/associated sxs/prior Treatment) Patient is a 32 y.o. male presenting with hand pain. The history is provided by the patient and medical records.  Hand Pain Associated symptoms include arthralgias and joint swelling.   Patient presents to the ED for right hand injury. Patient states he was cutting wood at his home when the ax handle hit his right middle finger. There was immediate pain and swelling. No laceration.  Patient states he has broken this finger before and it has always been slightly deformed.  Pt denies any numbness or paresthesias.  No prior right hand surgeries.  No meds taken PTA.  Has been applying ice with some relief.  Past Medical History  Diagnosis Date  . Tic disorder     Chronic  . ADD (attention deficit disorder)    Past Surgical History  Procedure Laterality Date  . Mri      L5-S1 problem---recvd nerve root block, denied Sx  . Hernia repair     Family History  Problem Relation Age of Onset  . Diabetes Other     GM  . Prostate cancer Neg Hx   . Colon cancer Neg Hx   . Heart attack Neg Hx    History  Substance Use Topics  . Smoking status: Never Smoker   . Smokeless tobacco: Not on file  . Alcohol Use: Yes     Comment: socially    Review of Systems  Musculoskeletal: Positive for joint swelling and arthralgias.  All other systems reviewed and are negative.    Allergies  Review of patient's allergies indicates no active allergies.  Home Medications   Current Outpatient Rx  Name  Route  Sig  Dispense  Refill  . Multiple Vitamin (MULTIVITAMIN) tablet   Oral   Take 1 tablet by mouth daily.            BP 138/103  Pulse 73  Temp(Src) 98 F (36.7 C) (Oral)  Resp 20  Ht 6' (1.829 m)  Wt 260 lb (117.935  kg)  BMI 35.25 kg/m2  SpO2 98%  Physical Exam  Nursing note and vitals reviewed. Constitutional: He is oriented to person, place, and time. He appears well-developed and well-nourished. No distress.  HENT:  Head: Normocephalic and atraumatic.  Eyes: Conjunctivae and EOM are normal. Pupils are equal, round, and reactive to light.  Neck: Normal range of motion. Neck supple.  Cardiovascular: Normal rate, regular rhythm and normal heart sounds.   Pulmonary/Chest: Effort normal and breath sounds normal.  Musculoskeletal: Normal range of motion.       Right hand: He exhibits tenderness, bony tenderness, deformity (at baseline) and swelling. He exhibits normal range of motion, normal capillary refill and no laceration. Normal sensation noted. Normal strength noted.       Hands: Right middle finger with swelling and bruising to the proximal phalanx; slight ulnar deviation, not of new onset; full flexion and extension with some pain; strong cap refill, sensation intact  Neurological: He is alert and oriented to person, place, and time.  Skin: Skin is warm and dry. He is not diaphoretic.  Psychiatric: He has a normal mood and affect.    ED Course  Procedures (including critical care time) Labs Review Labs Reviewed - No data to display Imaging Review  Dg Finger Middle Right  03/02/2013   CLINICAL DATA:  Middle finger pain, swelling, and bruising.  EXAM: RIGHT MIDDLE FINGER 2+V  COMPARISON:  None.  FINDINGS: There is no evidence of fracture or dislocation. There is no evidence of arthropathy or other focal bone abnormality. Soft tissues are unremarkable.  IMPRESSION: Negative.   Electronically Signed   By: Myles Rosenthal   On: 03/02/2013 13:41    MDM   1. Hand injury, right, initial encounter    X-ray negative for acute fracture or dislocation. Patient declined pain medication or splint at this time. He will be discharged and instructed to followup with his primary care physician if problems  occur.  Discussed plan with pt, he agreed.  Return precautions advised.  Garlon Hatchet, PA-C 03/02/13 1458

## 2013-03-02 NOTE — ED Notes (Addendum)
Pt was chopping wood and injured his R 3rd digit. History of fracture in this finger. Redness and deformity. No lacerations. Pt is applying ice with some relief of pain

## 2013-03-04 ENCOUNTER — Encounter: Payer: Self-pay | Admitting: Internal Medicine

## 2013-03-04 ENCOUNTER — Ambulatory Visit (INDEPENDENT_AMBULATORY_CARE_PROVIDER_SITE_OTHER): Payer: 59 | Admitting: Internal Medicine

## 2013-03-04 VITALS — BP 129/85 | HR 94 | Temp 98.9°F | Wt 259.6 lb

## 2013-03-04 DIAGNOSIS — F988 Other specified behavioral and emotional disorders with onset usually occurring in childhood and adolescence: Secondary | ICD-10-CM

## 2013-03-04 DIAGNOSIS — Z23 Encounter for immunization: Secondary | ICD-10-CM

## 2013-03-04 MED ORDER — AMPHETAMINE-DEXTROAMPHET ER 10 MG PO CP24: 10.0000 mg | ORAL_CAPSULE | ORAL | Status: DC

## 2013-03-04 NOTE — Patient Instructions (Signed)
Please do the urine test  today Next visit in 6 months Call for refills as needed

## 2013-03-04 NOTE — Assessment & Plan Note (Addendum)
Symptoms well-controlled, will sign a contract and get a UDS today although states had one  last month( no record found ). Refill medications today and as needed. Note ------ does not take adderall every day.

## 2013-03-04 NOTE — Progress Notes (Signed)
  Subjective:    Patient ID: RIDER ERMIS, male    DOB: January 17, 1981, 32 y.o.   MRN: [id number]  HPI Routine office visit History of ADHD, good compliance of medication, takes it only as needed.Symptoms are well-controlled.  Past Medical History  Diagnosis Date  . Tic disorder     Chronic  . ADD (attention deficit disorder)    Past Surgical History  Procedure Laterality Date  . Mri      L5-S1 problem---recvd nerve root block, denied Sx  . Hernia repair       Review of Systems Denies anxiety or depression. No insomnia.     Objective:   Physical Exam General -- alert, well-developed, NAD.   Lungs -- normal respiratory effort, no intercostal retractions, no accessory muscle use, and normal breath sounds.  Heart-- normal rate, regular rhythm, no murmur.   Extremities-- no pretibial edema bilaterally   Psych-- Cognition and judgment appear intact. Cooperative with normal attention span and concentration. No anxious appearing , no depressed appearing.      Assessment & Plan:

## 2013-04-08 ENCOUNTER — Telehealth: Payer: Self-pay | Admitting: *Deleted

## 2013-04-08 NOTE — Telephone Encounter (Signed)
David Buckley, Please ask the lab to print his UDS, was done at time of last visit

## 2013-04-08 NOTE — Telephone Encounter (Signed)
Patient called and requested refill for Adderall.  Last visit: 03/04/2013 Last filled"03/04/2013 UDS-not on file, contract signed  Please advise. SW

## 2013-04-14 ENCOUNTER — Telehealth: Payer: Self-pay | Admitting: *Deleted

## 2013-04-14 MED ORDER — AMPHETAMINE-DEXTROAMPHET ER 10 MG PO CP24: 10.0000 mg | ORAL_CAPSULE | ORAL | Status: DC

## 2013-04-14 NOTE — Telephone Encounter (Signed)
Patient called again for refill on Adderall. In last phobe note provider ws asking for UDS from last pick up. UDS was printed out adn given to [facility] for provider to review. Patient was told that as soon as provider looks over UDS then he will be notified. Patient also states that he did not get his prescription filled for September and was advised to bring in old prescription to receive new prescription. SW

## 2013-04-14 NOTE — Telephone Encounter (Signed)
Done. rx refilled per dr Drue Novel. Marland Kitchen DJR

## 2013-04-14 NOTE — Telephone Encounter (Signed)
UDS was given to David Buckley this morning.

## 2013-04-14 NOTE — Telephone Encounter (Signed)
I don't see a UDS, may be in transit to be scanned thus will RF.

## 2013-05-07 ENCOUNTER — Encounter: Payer: Self-pay | Admitting: Internal Medicine

## 2013-07-10 ENCOUNTER — Telehealth: Payer: Self-pay | Admitting: *Deleted

## 2013-07-10 NOTE — Telephone Encounter (Signed)
Please print a RX, I'll sign

## 2013-07-10 NOTE — Telephone Encounter (Signed)
amphetamine-dextroamphetamine (ADDERALL XR) 10 MG 24 hr capsule Last refill: 04/14/13 #30, 0 refills Last OV: 03/04/13 UDS up-to-date, low risk

## 2013-07-11 ENCOUNTER — Ambulatory Visit (HOSPITAL_BASED_OUTPATIENT_CLINIC_OR_DEPARTMENT_OTHER)
Admission: RE | Admit: 2013-07-11 | Discharge: 2013-07-11 | Disposition: A | Discharge status: Home / Self Care | Payer: 59 | Source: Ambulatory Visit | Attending: Internal Medicine | Admitting: Internal Medicine

## 2013-07-11 ENCOUNTER — Encounter: Payer: Self-pay | Admitting: Internal Medicine

## 2013-07-11 ENCOUNTER — Ambulatory Visit (INDEPENDENT_AMBULATORY_CARE_PROVIDER_SITE_OTHER): Payer: 59 | Admitting: Internal Medicine

## 2013-07-11 VITALS — BP 127/89 | HR 100 | Temp 97.9°F | Wt 262.0 lb

## 2013-07-11 DIAGNOSIS — R079 Chest pain, unspecified: Secondary | ICD-10-CM

## 2013-07-11 DIAGNOSIS — F988 Other specified behavioral and emotional disorders with onset usually occurring in childhood and adolescence: Secondary | ICD-10-CM

## 2013-07-11 MED ORDER — AMPHETAMINE-DEXTROAMPHET ER 10 MG PO CP24: 10.0000 mg | ORAL_CAPSULE | ORAL | Status: DC

## 2013-07-11 MED ORDER — PREDNISONE 10 MG PO TABS: ORAL_TABLET | ORAL | Status: DC

## 2013-07-11 NOTE — Patient Instructions (Signed)
Get the XR at [facility] [place], corner of [address] (10 minutes form here); they are open 24/7 [address]  [place], [place] [address] [phone]   Prednisone as prescribed Tylenol  500 mg OTC 2 tabs a day every 8 hours as needed for pain  Call if no better in 10-14 days

## 2013-07-11 NOTE — Progress Notes (Signed)
Pre visit review using our clinic review tool, if applicable. No additional management support is needed unless otherwise documented below in the visit note. 

## 2013-07-11 NOTE — Assessment & Plan Note (Signed)
RF meds  

## 2013-07-11 NOTE — Progress Notes (Signed)
   Subjective:    Patient ID: David CornerMatthew N Buckley, male    DOB: [birth year], 33 y.o.   MRN: [id number]  DOS:  07/11/2013 Acute visit , here to discuss the following issues  2 weeks ago, he was lying down in bed, he tried to push him up with his right arm and immediately felt a pop in the anterior right upper chest, since then, the area has a dull constant pain, pain increase with certain positions. No pain within the shoulder itself.  Past Medical History  Diagnosis Date  . Tic disorder     Chronic  . ADD (attention deficit disorder)     Past Surgical History  Procedure Laterality Date  . Mri      L5-S1 problem---recvd nerve root block, denied Sx  . Hernia repair      History   Social History  . Marital Status: Married    Spouse Name: N/A    Number of Children: 2  . Years of Education: N/A   Occupational History  . commercial real estate      Social History Main Topics  . Smoking status: Never Smoker   . Smokeless tobacco: Never Used  . Alcohol Use: Yes     Comment: socially  . Drug Use: No  . Sexual Activity: Not on file   Other Topics Concern  . Not on file   Social History Narrative                   Family History  Problem Relation Age of Onset  . Diabetes Other     GM  . Prostate cancer Neg Hx   . Colon cancer Neg Hx   . Heart attack Neg Hx     ROS Denies fever or chills No cough or shortness or breath but the pain increased with deep breaths. Denies any recent calf pain or swelling      Objective:   Physical Exam BP 127/89  Pulse 100  Temp(Src) 97.9 F (36.6 C)  Wt 262 lb (118.842 kg)  SpO2 97% General -- alert, well-developed, NAD.  Neck --noTTP at the Cspine Lungs -- normal respiratory effort, no intercostal retractions, no accessory muscle use, and normal breath sounds.  Chest wall with minimal if any tenderness at the upper R side  Extremities-- no pretibial edema bilaterally ;Shoulders symmetric, range of motion normal, bicipital  muscles and tendons normal bilaterally. Neurologic--  alert & oriented X3. Speech normal, gait normal, strength normal in all extremities.  Psych-- Cognition and judgment appear intact. Cooperative with normal attention span and concentration. No anxious or depressed appearing.        Assessment & Plan:   Chest wall sprain, Most likely the patient has chest wall sprain, no obvious injury in the shoulder itself, no neck injury. To be complete, we will get a chest x-ray to rule out pneumothorax although that is unlikely. Otherwise Tylenol, steroids, call if not improving.

## 2013-07-13 ENCOUNTER — Encounter: Payer: Self-pay | Admitting: Internal Medicine

## 2013-07-17 ENCOUNTER — Ambulatory Visit (INDEPENDENT_AMBULATORY_CARE_PROVIDER_SITE_OTHER): Payer: 59 | Admitting: Family Medicine

## 2013-07-17 ENCOUNTER — Encounter: Payer: Self-pay | Admitting: Family Medicine

## 2013-07-17 VITALS — BP 110/80 | HR 127 | Temp 98.5°F | Wt 262.0 lb

## 2013-07-17 DIAGNOSIS — R05 Cough: Secondary | ICD-10-CM

## 2013-07-17 DIAGNOSIS — J329 Chronic sinusitis, unspecified: Secondary | ICD-10-CM

## 2013-07-17 DIAGNOSIS — R059 Cough, unspecified: Secondary | ICD-10-CM

## 2013-07-17 MED ORDER — CEFUROXIME AXETIL 500 MG PO TABS: 500.0000 mg | ORAL_TABLET | Freq: Two times a day (BID) | ORAL | Status: AC

## 2013-07-17 MED ORDER — GUAIFENESIN-CODEINE 100-10 MG/5ML PO SYRP: ORAL_SOLUTION | ORAL | Status: DC

## 2013-07-17 MED ORDER — BECLOMETHASONE DIPROPIONATE 80 MCG/ACT NA AERS: INHALATION_SPRAY | NASAL | Status: DC

## 2013-07-17 NOTE — Progress Notes (Signed)
Pre visit review using our clinic review tool, if applicable. No additional management support is needed unless otherwise documented below in the visit note. 

## 2013-07-17 NOTE — Patient Instructions (Signed)

## 2013-07-17 NOTE — Progress Notes (Signed)
  Subjective:     David CornerMatthew N Hyde is a 33 y.o. male who presents for evaluation of sinus pain. Symptoms include: congestion, cough, facial pain, fevers, headaches, nasal congestion, purulent rhinorrhea and sinus pressure. Onset of symptoms was 4 days ago. Symptoms have been gradually worsening since that time. Past history is significant for no history of pneumonia or bronchitis. Patient is a non-smoker.  The following portions of the patient's history were reviewed and updated as appropriate: allergies, current medications, past family history, past medical history, past social history, past surgical history and problem list.  Review of Systems Pertinent items are noted in HPI.   Objective:    BP 110/80  Pulse 127  Temp(Src) 98.5 F (36.9 C) (Oral)  Wt 262 lb (118.842 kg)  SpO2 94% General appearance: alert, cooperative, appears stated age and no distress Ears: normal TM's and external ear canals both ears and myringotomy tubes in place Nose: green discharge, moderate congestion, turbinates red, swollen, sinus tenderness bilateral Throat: lips, mucosa, and tongue normal; teeth and gums normal Neck: mild anterior cervical adenopathy, supple, symmetrical, trachea midline and thyroid not enlarged, symmetric, no tenderness/mass/nodules Lungs: clear to auscultation bilaterally Heart: S1, S2 normal    Assessment:    Acute bacterial sinusitis.    Plan:    Nasal steroids per medication orders. Antihistamines per medication orders. Ceftin per medication orders.

## 2013-09-02 ENCOUNTER — Telehealth: Payer: Self-pay | Admitting: Internal Medicine

## 2013-09-02 MED ORDER — AMPHETAMINE-DEXTROAMPHET ER 10 MG PO CP24: 10.0000 mg | ORAL_CAPSULE | ORAL | Status: DC

## 2013-09-02 NOTE — Telephone Encounter (Signed)
Patient called and requested a refill for amphetamine-dextroamphetamine (ADDERALL XR) 10 MG 24 hr capsule

## 2013-09-02 NOTE — Telephone Encounter (Signed)
Done, 2 Rx printed

## 2013-09-02 NOTE — Telephone Encounter (Signed)
Pt notified. rx rdy for pick up at front desk.  

## 2013-09-02 NOTE — Telephone Encounter (Signed)
rx refill- adderall xr 10mg   Last OV- 07/11/13 Last refilled- 07/11/13 #30 / 0 rf  Last UDS- 03/04/13 LOW risk

## 2014-01-07 ENCOUNTER — Telehealth: Payer: Self-pay | Admitting: Internal Medicine

## 2014-01-07 NOTE — Telephone Encounter (Signed)
Caller name: matt  Call back number: [phone]   Reason for call: pt would like a refill on rx amphetamine-dextroamphetamine (ADDERALL XR) 10 MG 24 hr capsule [[id number]][[id number]]

## 2014-01-08 NOTE — Telephone Encounter (Signed)
amphetamine-dextroamphetamine (ADDERALL XR) 10 MG 24 hr capsule Last OV- 07/11/13 Last refilled- 09/02/13 # 30 / 0 rf  UDS- 03/04/13 LOW risk

## 2014-01-09 MED ORDER — AMPHETAMINE-DEXTROAMPHET ER 10 MG PO CP24: 10.0000 mg | ORAL_CAPSULE | ORAL | Status: DC

## 2014-01-09 NOTE — Telephone Encounter (Signed)
Done x 2 rx

## 2014-01-09 NOTE — Telephone Encounter (Signed)
Spoke with Pt, Rx will be placed at the front desk for pick up.

## 2014-04-17 ENCOUNTER — Telehealth: Payer: Self-pay | Admitting: Internal Medicine

## 2014-04-17 MED ORDER — AMPHETAMINE-DEXTROAMPHET ER 10 MG PO CP24: 10.0000 mg | ORAL_CAPSULE | ORAL | Status: DC

## 2014-04-17 NOTE — Telephone Encounter (Signed)
Requesting refill on adderall

## 2014-04-17 NOTE — Telephone Encounter (Signed)
Pt is requesting refill on Adderall.  Last OV: 07/11/2013  Last Fill: 01/09/2014 # 30 0RF UDS: 03/04/2013 Low risk  Please advise.

## 2014-04-17 NOTE — Telephone Encounter (Signed)
Call patient: Prescription printed, needs a routine  office visit

## 2014-04-20 NOTE — Telephone Encounter (Signed)
Spoke with Pt, informed him medication is ready to pick up at front desk.

## 2014-04-29 ENCOUNTER — Ambulatory Visit: Payer: 59

## 2014-05-04 ENCOUNTER — Other Ambulatory Visit: Payer: Self-pay

## 2014-06-03 ENCOUNTER — Ambulatory Visit (INDEPENDENT_AMBULATORY_CARE_PROVIDER_SITE_OTHER): Payer: 59 | Admitting: Internal Medicine

## 2014-06-03 ENCOUNTER — Encounter: Payer: Self-pay | Admitting: Internal Medicine

## 2014-06-03 ENCOUNTER — Ambulatory Visit (INDEPENDENT_AMBULATORY_CARE_PROVIDER_SITE_OTHER): Payer: 59

## 2014-06-03 VITALS — BP 124/78 | HR 82 | Temp 97.6°F | Ht 72.0 in | Wt 251.2 lb

## 2014-06-03 DIAGNOSIS — Z23 Encounter for immunization: Secondary | ICD-10-CM

## 2014-06-03 DIAGNOSIS — F909 Attention-deficit hyperactivity disorder, unspecified type: Secondary | ICD-10-CM

## 2014-06-03 DIAGNOSIS — F988 Other specified behavioral and emotional disorders with onset usually occurring in childhood and adolescence: Secondary | ICD-10-CM

## 2014-06-03 DIAGNOSIS — Z0000 Encounter for general adult medical examination without abnormal findings: Secondary | ICD-10-CM | POA: Insufficient documentation

## 2014-06-03 LAB — CBC WITH DIFFERENTIAL/PLATELET
Basophils Absolute: 0 10*3/uL (ref 0.0–0.1)
Basophils Relative: 0.4 % (ref 0.0–3.0)
EOS ABS: 0.2 10*3/uL (ref 0.0–0.7)
Eosinophils Relative: 3.2 % (ref 0.0–5.0)
HEMATOCRIT: 43.5 % (ref 39.0–52.0)
HEMOGLOBIN: 14.9 g/dL (ref 13.0–17.0)
LYMPHS ABS: 2.1 10*3/uL (ref 0.7–4.0)
Lymphocytes Relative: 45.3 % (ref 12.0–46.0)
MCHC: 34.3 g/dL (ref 30.0–36.0)
MCV: 89.9 fl (ref 78.0–100.0)
MONO ABS: 0.4 10*3/uL (ref 0.1–1.0)
MONOS PCT: 9.1 % (ref 3.0–12.0)
NEUTROS ABS: 2 10*3/uL (ref 1.4–7.7)
Neutrophils Relative %: 42 % — ABNORMAL LOW (ref 43.0–77.0)
Platelets: 238 10*3/uL (ref 150.0–400.0)
RBC: 4.84 Mil/uL (ref 4.22–5.81)
RDW: 13.1 % (ref 11.5–15.5)
WBC: 4.7 10*3/uL (ref 4.0–10.5)

## 2014-06-03 LAB — LIPID PANEL
Cholesterol: 213 mg/dL — ABNORMAL HIGH (ref 0–200)
HDL: 54.3 mg/dL (ref >39.00)
LDL Cholesterol: 132 mg/dL — ABNORMAL HIGH (ref 0–99)
NonHDL: 158.7
TRIGLYCERIDES: 132 mg/dL (ref 0.0–149.0)
Total CHOL/HDL Ratio: 4
VLDL: 26.4 mg/dL (ref 0.0–40.0)

## 2014-06-03 LAB — COMPREHENSIVE METABOLIC PANEL
ALT: 49 U/L (ref 0–53)
AST: 32 U/L (ref 0–37)
Albumin: 4.7 g/dL (ref 3.5–5.2)
Alkaline Phosphatase: 57 U/L (ref 39–117)
BUN: 17 mg/dL (ref 6–23)
CO2: 25 mEq/L (ref 19–32)
CREATININE: 0.8 mg/dL (ref 0.4–1.5)
Calcium: 9.5 mg/dL (ref 8.4–10.5)
Chloride: 105 mEq/L (ref 96–112)
GFR: 112.14 mL/min (ref >60.00)
Glucose, Bld: 87 mg/dL (ref 70–99)
POTASSIUM: 4.2 meq/L (ref 3.5–5.1)
Sodium: 138 mEq/L (ref 135–145)
Total Bilirubin: 0.5 mg/dL (ref 0.2–1.2)
Total Protein: 7.7 g/dL (ref 6.0–8.3)

## 2014-06-03 LAB — TSH: TSH: 1.52 u[IU]/mL (ref 0.35–4.50)

## 2014-06-03 MED ORDER — AMPHETAMINE-DEXTROAMPHET ER 10 MG PO CP24: 10.0000 mg | ORAL_CAPSULE | ORAL | Status: DC

## 2014-06-03 NOTE — Patient Instructions (Signed)
Get your blood work before you leave  We also need a urine sample    Please come back to the office 6-8 months  for a routine check up       Testicular Self-Exam A self-examination of your testicles involves looking at and feeling your testicles for abnormal lumps or swelling. Several things can cause swelling, lumps, or pain in your testicles. Some of these causes are:  Injuries.  Inflammation.  Infection.  Accumulation of fluids around your testicle (hydrocele).  Twisted testicles (testicular torsion).  Testicular cancer. Self-examination of the testicles and groin areas may be advised if you are at risk for testicular cancer. Risks for testicular cancer include:  An undescended testicle (cryptorchidism).  A history of previous testicular cancer.  A family history of testicular cancer. The testicles are easiest to examine after warm baths or showers and are more difficult to examine when you are cold. This is because the muscles attached to the testicles retract and pull them up higher or into the abdomen. Follow these steps while you are standing:  Hold your penis away from your body.  Roll one testicle between your thumb and forefinger, feeling the entire testicle.  Roll the other testicle between your thumb and forefinger, feeling the entire testicle. Feel for lumps, swelling, or discomfort. A normal testicle is egg shaped and feels firm. It is smooth and not tender. The spermatic cord can be felt as a firm spaghetti-like cord at the back of your testicle. It is also important to examine the crease between the front of your leg and your abdomen. Feel for any bumps that are tender. These could be enlarged lymph nodes.  Document Released: 08/28/2000 Document Revised: 01/22/2013 Document Reviewed: 11/11/2012 [facility] Patient Information 2015 [employer], [place]. This information is not intended to replace advice given to you by your health care provider. Make sure you discuss  any questions you have with your health care provider.

## 2014-06-03 NOTE — Assessment & Plan Note (Signed)
Continue adderall UDS  RF as needed RTC 6-8 months

## 2014-06-03 NOTE — Progress Notes (Signed)
   Subjective:    Patient ID: David Buckley, male    DOB: [birth year], 33 y.o.   MRN: [id number]  DOS:  06/03/2014 Type of visit - description : cpx Interval history: For the last year, he is experiencing on and off back pain, mostly when he lift small weights, has not prevented him from doing anything but is really bothesome. ADD, good compliance of medication, no apparent side effects  ROS  denies chest pain or difficulty breathing No nausea, vomiting, diarrhea or blood in the stools No cough or sputum production No anxiety depression or difficulty sleeping No dysuria or gross hematuria  Past Medical History  Diagnosis Date  . Tic disorder     Chronic  . ADD (attention deficit disorder)     Past Surgical History  Procedure Laterality Date  . Mri      L5-S1 problem---recvd nerve root block, denied Sx  . Hernia repair    . Tympanostomy tube placement  2004    History   Social History  . Marital Status: Married    Spouse Name: N/A    Number of Children: 2  . Years of Education: N/A   Occupational History  . commercial real estate      Social History Main Topics  . Smoking status: Never Smoker   . Smokeless tobacco: Never Used  . Alcohol Use: Yes     Comment: socially  . Drug Use: No  . Sexual Activity: Not on file   Other Topics Concern  . Not on file   Social History Narrative   Household -- pt, wife, 2 children                 Family History  Problem Relation Age of Onset  . Diabetes Other     GM  . Prostate cancer Neg Hx   . Colon cancer Neg Hx   . Heart attack Neg Hx        Medication List       This list is accurate as of: 06/03/14 11:59 PM.  Always use your most recent med list.               amphetamine-dextroamphetamine 10 MG 24 hr capsule  Commonly known as:  ADDERALL XR  Take 1 capsule (10 mg total) by mouth every morning.     multivitamin tablet  Take 1 tablet by mouth daily.           Objective:   Physical  Exam BP 124/78 mmHg  Pulse 82  Temp(Src) 97.6 F (36.4 C) (Oral)  Ht 6' (1.829 m)  Wt 251 lb 4 oz (113.966 kg)  BMI 34.07 kg/m2  SpO2 98% General -- alert, well-developed, NAD.  Neck --no thyromegaly , normal carotid pulse  HEENT-- Not pale.   Lungs -- normal respiratory effort, no intercostal retractions, no accessory muscle use, and normal breath sounds.  Heart-- normal rate, regular rhythm, no murmur.  Abdomen-- Not distended, good bowel sounds,soft, non-tender. Extremities-- no pretibial edema bilaterally  Neurologic--  alert & oriented X3. Speech normal, gait appropriate for age, strength symmetric and appropriate for age.  Psych-- Cognition and judgment appear intact. Cooperative with normal attention span and concentration. No anxious or depressed appearing.         Assessment & Plan:

## 2014-06-03 NOTE — Progress Notes (Signed)
Pre visit review using our clinic review tool, if applicable. No additional management support is needed unless otherwise documented below in the visit note. 

## 2014-06-03 NOTE — Assessment & Plan Note (Addendum)
Td-2010 Flu shot discussed Labs (not fasting) Diet-exercise-STE  Discussed  Other issues: 1 year history of back pain on and off, similar to symptoms from 2005, at that time she got a local injection that helped. Recommend to see ortho

## 2014-06-15 ENCOUNTER — Telehealth: Payer: Self-pay

## 2014-06-15 NOTE — Telephone Encounter (Signed)
UDS: 06/03/2014  Positive for Adderall Negative for Naltrexone  Low risk per Dr. Drue NovelPaz 06/12/2014

## 2014-06-22 ENCOUNTER — Encounter: Payer: Self-pay | Admitting: Internal Medicine

## 2014-07-13 ENCOUNTER — Telehealth: Payer: Self-pay | Admitting: Internal Medicine

## 2014-07-13 NOTE — Telephone Encounter (Signed)
Caller name: Molli HazardMatthew Relation to pt: self Call back number: [phone] Pharmacy:  Reason for call:   Patient states that he filled the first month of adderall and now his insurance is requiring a prior auth before he fills the additional refills.

## 2014-07-13 NOTE — Telephone Encounter (Signed)
PA initiated through [employer]. Awaiting determination. JG//CMA 

## 2014-07-16 NOTE — Telephone Encounter (Signed)
Approved effective 07/16/2014 through 07/17/2015. JG//CMA

## 2014-10-20 ENCOUNTER — Telehealth: Payer: Self-pay | Admitting: Internal Medicine

## 2014-10-20 NOTE — Telephone Encounter (Signed)
Okay #30, print 2 prescriptions

## 2014-10-20 NOTE — Telephone Encounter (Signed)
Pt is requesting refill on Adderall.  Last OV: 06/03/2014  Last Fill: 06/03/2014 # 30 0RF UDS: 06/03/2014 Low risk  Please advise.

## 2014-10-20 NOTE — Telephone Encounter (Signed)
Caller name: Kelcey Relation to pt: self Call back number: [phone] Pharmacy:  Reason for call:   Requesting a new adderall rx

## 2014-10-21 MED ORDER — AMPHETAMINE-DEXTROAMPHET ER 10 MG PO CP24: 10.0000 mg | ORAL_CAPSULE | ORAL | Status: DC

## 2014-10-21 NOTE — Telephone Encounter (Signed)
Rx's for May and June 2016 printed, awaiting MD signature.

## 2014-10-21 NOTE — Telephone Encounter (Signed)
Spoke with Pt, informed him Rx's are ready for pick up at front desk. Pt verbalized understanding.

## 2014-11-09 ENCOUNTER — Encounter: Payer: Self-pay | Admitting: Internal Medicine

## 2014-11-09 ENCOUNTER — Telehealth: Payer: Self-pay | Admitting: Internal Medicine

## 2014-11-09 ENCOUNTER — Ambulatory Visit (INDEPENDENT_AMBULATORY_CARE_PROVIDER_SITE_OTHER): Payer: Managed Care, Other (non HMO) | Admitting: Internal Medicine

## 2014-11-09 ENCOUNTER — Ambulatory Visit (HOSPITAL_BASED_OUTPATIENT_CLINIC_OR_DEPARTMENT_OTHER)
Admission: RE | Admit: 2014-11-09 | Discharge: 2014-11-09 | Disposition: A | Discharge status: Home / Self Care | Payer: Managed Care, Other (non HMO) | Source: Ambulatory Visit | Attending: Internal Medicine | Admitting: Internal Medicine

## 2014-11-09 VITALS — BP 129/84 | HR 97 | Temp 98.2°F | Ht 72.0 in | Wt 256.1 lb

## 2014-11-09 DIAGNOSIS — X58XXXA Exposure to other specified factors, initial encounter: Secondary | ICD-10-CM | POA: Insufficient documentation

## 2014-11-09 DIAGNOSIS — R0602 Shortness of breath: Secondary | ICD-10-CM | POA: Insufficient documentation

## 2014-11-09 DIAGNOSIS — S20229A Contusion of unspecified back wall of thorax, initial encounter: Secondary | ICD-10-CM

## 2014-11-09 NOTE — Progress Notes (Signed)
Pre visit review using our clinic review tool, if applicable. No additional management support is needed unless otherwise documented below in the visit note. 

## 2014-11-09 NOTE — Patient Instructions (Signed)
Get the xray.

## 2014-11-09 NOTE — Telephone Encounter (Signed)
Patient seen in office today. 

## 2014-11-09 NOTE — Telephone Encounter (Signed)
Pt called in experiencing shortness of breath. Appt sched for today 2:00pm with Dr. Drue NovelPaz. Transferred pt to [facility], Tory pt coordinator.

## 2014-11-09 NOTE — Telephone Encounter (Signed)
E NOTE: All timestamps contained within this report are represented as [place] Standard Time. CONFIDENTIALTY NOTICE: This fax transmission is intended only for the addressee. It contains information that is legally privileged, confidential or otherwise protected from use or disclosure. If you are not the intended recipient, you are strictly prohibited from reviewing, disclosing, copying using or disseminating any of this information or taking any action in reliance on or regarding this information. If you have received this fax in error, please notify us immediately by telephone so that we can arrange for its return to us. Phone: [phone], Toll-Free: [phone], Fax: [phone] Page: 1 of 1 Call Id: [id number] [facility] - Client TELEPHONE ADVICE RECORD [facility] Patient Name: David GutterMATTHEW Buckley DOB: 05/05/1981 Initial Comment Caller states the patient is having shortness of breath Nurse Assessment Nurse: Chrys RacerKoenig, RN, David FreestoneAnna Marie Date/Time [name](Eastern Time): 11/09/2014 9:41:08 AM Confirm and document reason for call. If symptomatic, describe symptoms. ---Caller states the he cannot fully inhale/exhale. No wheezing. Currently comfortable. No other s/s. No jaw/arm pain. No SOB. States his daughter jumped on his back (35 lbs) while he laying flat on the ground and these s/s followed (happened 2 days ago) Has the patient traveled out of the country within the last 30 days? ---No Does the patient require triage? ---Yes Related visit to physician within the last 2 weeks? ---No Does the PT have any chronic conditions? (i.e. diabetes, asthma, etc.) ---Yes List chronic conditions. ---ADHD Guidelines Guideline Title Affirmed Question Affirmed Notes Final Disposition User Comments pt has appt at 2 pm today (made earlier independently, prior to call)

## 2014-11-09 NOTE — Progress Notes (Signed)
Subjective:    Patient ID: David Buckley, male    DOB: [birth year], 34 y.o.   MRN: [id number]  DOS:  11/09/2014 Type of visit - description : acute visit Interval history:  Patient is a 34 year old healthy male presenting to clinic today because his "lungs don't feel right." The patient was laying on his stomach playing with his children 3 days ago when his 643 yo daughter jumped on his upper back. He had no problems until the next day when he woke up the next morning. Breathing in deeply and exhaling completely exacerbate his condition. Patient cannot characterize exactly how his breathing feels, but describes an odd feeling in his left chest that occurs episodically. He denies any pain, and denies gasping for air or inability to draw a full breath. Denies any recent travel or long car rides, and denies any leg swelling.   Review of Systems  Constitutional: No fever. No chills.  Respiratory: As per HPI  Cardiovascular: No SSCP, no leg swelling, no  palpitations  GI: no nausea, no vomiting, no diarrhea, no abdominal pain.   GU: No dysuria, gross hematuria, difficulty urinating. No urinary urgency, no frequency.   Musculoskeletal: No joint swellings or unusual aches or pains  Neurological: No dizziness or headaches. No diplopia.   Past Medical History  Diagnosis Date  . Tic disorder     Chronic  . ADD (attention deficit disorder)     Past Surgical History  Procedure Laterality Date  . Mri      L5-S1 problem---recvd nerve root block, denied Sx  . Hernia repair    . Tympanostomy tube placement  2004    History   Social History  . Marital Status: Married    Spouse Name: N/A  . Number of Children: 2  . Years of Education: N/A   Occupational History  . commercial real estate      Social History Main Topics  . Smoking status: Never Smoker   . Smokeless tobacco: Never Used  . Alcohol Use: Yes     Comment: socially  . Drug Use: No  . Sexual Activity: Not on file    Other Topics Concern  . Not on file   Social History Narrative   Household -- pt, wife, 2 children                 Family History  Problem Relation Age of Onset  . Diabetes Other     GM  . Prostate cancer Neg Hx   . Colon cancer Neg Hx   . Heart attack Neg Hx        Medication List       This list is accurate as of: 11/09/14  2:25 PM.  Always use your most recent med list.               amphetamine-dextroamphetamine 10 MG 24 hr capsule  Commonly known as:  ADDERALL XR  Take 1 capsule (10 mg total) by mouth every morning.     multivitamin tablet  Take 1 tablet by mouth daily.           Objective:   Physical Exam BP 129/84 mmHg  Pulse 97  Temp(Src) 98.2 F (36.8 C) (Oral)  Ht 6' (1.829 m)  Wt 256 lb 2 oz (116.178 kg)  BMI 34.73 kg/m2  SpO2 97%  General:   Well developed, well nourished . NAD.  HEENT:  Normocephalic . Face symmetric, atraumatic Lungs:  CTA B  Normal respiratory effort, no intercostal retractions, no accessory muscle use. Heart: RRR,  no murmur.  No pretibial edema bilaterally ; calves symmetric  Skin: Not pale. Not jaundice. Back not ecchymotic.  Musculoskeletal: Spine not TTP Neurologic:  alert & oriented X3.  Speech normal, gait appropriate for age and unassisted Psych--  Cognition and judgment appear intact.  Cooperative with normal attention span and concentration.  Behavior appropriate. No anxious or depressed appearing.      Assessment & Plan:   (Patient seen along with   Merilynn Finland, medical student)  Back: Patient notes his daughter jumped on his back three days ago while he was lying on his stomach and he noted no significant symptoms until the following day when he noted his "lungs don't feel right".  He notes the discomfort occurs intermittently and is mainly in his left chest. He denies difficulty breathing or feeling like he is gasping for air.  No recent travel and no leg swelling.  No spinal  tenderness. Plan: Likely a musculoskeletal but  will order CXR for further eval. Patient instructed to take tylenol and motrin prn for pain and to contact us if more symptoms develop. Will call patient with results.

## 2015-03-17 ENCOUNTER — Telehealth: Payer: Self-pay | Admitting: Internal Medicine

## 2015-03-17 MED ORDER — AMPHETAMINE-DEXTROAMPHET ER 10 MG PO CP24: 10.0000 mg | ORAL_CAPSULE | ORAL | Status: DC

## 2015-03-17 NOTE — Telephone Encounter (Signed)
Rx's for October and November 2016 printed, awaiting MD signature. Controlled Substance Contract printed.  

## 2015-03-17 NOTE — Telephone Encounter (Signed)
Pt is requesting refill on Adderall.  Last OV: 11/09/2014, no future appt scheduled Last Fill: 10/21/2014 #30 and 0RF for June 2016 UDS: 06/03/2014 Low risk  Needs UDS and Contract at time of Rx pick up.  Please advise.

## 2015-03-17 NOTE — Telephone Encounter (Signed)
Pt needing refill on Adderall. He has 3 days left. Please call when RX is ready at [phone]

## 2015-03-17 NOTE — Telephone Encounter (Signed)
Ok x 2 Rxs, needs UDS  Ask to schedule a cpx at his convenience

## 2015-03-17 NOTE — Telephone Encounter (Signed)
LMOM informing Pt that Rx has been placed at front desk for his convenience. Also informed him he is due for yearly CPE within 3 months and can schedule at his convenience when he comes to pick up prescription.

## 2015-03-23 ENCOUNTER — Ambulatory Visit (INDEPENDENT_AMBULATORY_CARE_PROVIDER_SITE_OTHER): Payer: Managed Care, Other (non HMO)

## 2015-03-23 DIAGNOSIS — Z23 Encounter for immunization: Secondary | ICD-10-CM

## 2015-03-29 ENCOUNTER — Telehealth: Payer: Self-pay

## 2015-03-29 NOTE — Telephone Encounter (Signed)
UDS: 03/23/2015  Negative for Adderall: PRN    Low risk per Dr. Drue NovelPaz 03/29/2015

## 2015-05-29 ENCOUNTER — Encounter: Payer: Self-pay | Admitting: Medical

## 2015-05-29 ENCOUNTER — Ambulatory Visit (INDEPENDENT_AMBULATORY_CARE_PROVIDER_SITE_OTHER): Payer: Managed Care, Other (non HMO) | Admitting: Medical

## 2015-05-29 VITALS — BP 110/80 | HR 98 | Temp 98.7°F | Ht 72.0 in | Wt 261.0 lb

## 2015-05-29 DIAGNOSIS — J069 Acute upper respiratory infection, unspecified: Secondary | ICD-10-CM

## 2015-05-29 DIAGNOSIS — R0981 Nasal congestion: Secondary | ICD-10-CM

## 2015-05-29 DIAGNOSIS — R05 Cough: Secondary | ICD-10-CM

## 2015-05-29 DIAGNOSIS — R059 Cough, unspecified: Secondary | ICD-10-CM

## 2015-05-29 MED ORDER — FLUTICASONE PROPIONATE 50 MCG/ACT NA SUSP: 2.0000 | Freq: Every day | NASAL | Status: DC

## 2015-05-29 MED ORDER — BENZONATATE 100 MG PO CAPS: 100.0000 mg | ORAL_CAPSULE | Freq: Three times a day (TID) | ORAL | Status: DC | PRN

## 2015-05-29 MED ORDER — AZITHROMYCIN 250 MG PO TABS: ORAL_TABLET | ORAL | Status: DC

## 2015-05-29 NOTE — Progress Notes (Signed)
Pre visit review using our clinic review tool, if applicable. No additional management support is needed unless otherwise documented below in the visit note. 

## 2015-05-29 NOTE — Addendum Note (Signed)
Addended by: Azucena FreedMILLNER, Acheron Sugg C on: 05/29/2015 12:15 PM   Modules accepted: Orders

## 2015-05-29 NOTE — Patient Instructions (Signed)
You appear to have uri presently but may be get bronchitis or sinus infection over next 2-3 days.  I rx'd flonase for nasal congestion, benzonatate for cough, and azithromycin antibiotic to start if signs or symptoms worsen as discussed.  Follow up in 7 days or as needed

## 2015-05-29 NOTE — Progress Notes (Signed)
Subjective:    Patient ID: David Buckley, male    DOB: [birth year], 34 y.o.   MRN: [id number]  HPI   Pt in with some nasal congestion, st, hoarse voice and worsening cough. Some productive cough.(Symptoms since Monday)  No fever, no chills, or sweats.   No hx of bronchitis.   No hx of smoking.    Review of Systems  Constitutional: Negative for fever, chills and fatigue.  HENT: Positive for congestion and sore throat. Negative for hearing loss, nosebleeds, postnasal drip, sinus pressure and sneezing.        Faint scrathy throat.  Respiratory: Positive for cough. Negative for chest tightness, shortness of breath and wheezing.   Cardiovascular: Negative for chest pain and palpitations.  Musculoskeletal: Negative for back pain.  Neurological: Negative for facial asymmetry and headaches.  Hematological: Negative for adenopathy. Does not bruise/bleed easily.   Past Medical History  Diagnosis Date  . Tic disorder     Chronic  . ADD (attention deficit disorder)     Social History   Social History  . Marital Status: Married    Spouse Name: N/A  . Number of Children: 2  . Years of Education: N/A   Occupational History  . commercial real estate      Social History Main Topics  . Smoking status: Never Smoker   . Smokeless tobacco: Never Used  . Alcohol Use: Yes     Comment: socially  . Drug Use: No  . Sexual Activity: Not on file   Other Topics Concern  . Not on file   Social History Narrative   Household -- pt, wife, 2 children                Past Surgical History  Procedure Laterality Date  . Mri      L5-S1 problem---recvd nerve root block, denied Sx  . Hernia repair    . Tympanostomy tube placement  2004    Family History  Problem Relation Age of Onset  . Diabetes Other     GM  . Prostate cancer Neg Hx   . Colon cancer Neg Hx   . Heart attack Neg Hx     No Known Allergies  Current Outpatient Prescriptions on File Prior to Visit  Medication  Sig Dispense Refill  . amphetamine-dextroamphetamine (ADDERALL XR) 10 MG 24 hr capsule Take 1 capsule (10 mg total) by mouth every morning. 30 capsule 0  . amphetamine-dextroamphetamine (ADDERALL XR) 10 MG 24 hr capsule Take 1 capsule (10 mg total) by mouth every morning. 30 capsule 0  . Multiple Vitamin (MULTIVITAMIN) tablet Take 1 tablet by mouth daily.       No current facility-administered medications on file prior to visit.    BP 110/80 mmHg  Pulse 98  Temp(Src) 98.7 F (37.1 C) (Oral)  Ht 6' (1.829 m)  Wt 261 lb (118.389 kg)  BMI 35.39 kg/m2  SpO2 98%         Objective:   Physical Exam  Mental Status - Alert. General Appearance - Well groomed. Not in acute distress.  Skin Rashes- No Rashes.  HEENT Head- Normal. Ear Auditory Canal - Left- Normal. Right - Normal.Tympanic Membrane- Left- Normal. Right- Normal. Eye Sclera/Conjunctiva- Left- Normal. Right- Normal. Nose & Sinuses Nasal Mucosa- Left-  Boggy and Congested. Right-  Boggy and  Congested.Bilateral no  maxillary and  No frontal sinus pressure. Mouth & Throat Lips: Upper Lip- Normal: no dryness, cracking, pallor, cyanosis, or vesicular eruption. Lower  Lip-Normal: no dryness, cracking, pallor, cyanosis or vesicular eruption. Buccal Mucosa- Bilateral- No Aphthous ulcers. Oropharynx- No Discharge or Erythema. Tonsils: Characteristics- Bilateral- No Erythema or Congestion. Size/Enlargement- Bilateral- No enlargement. Discharge- bilateral-None.  Neck Neck- Supple. No Masses.   Chest and Lung Exam Auscultation: Breath Sounds:-Clear even and unlabored.  Cardiovascular Auscultation:Rythm- Regular, rate and rhythm. Murmurs & Other Heart Sounds:Ausculatation of the heart reveal- No Murmurs.  Lymphatic Head & Neck General Head & Neck Lymphatics: Bilateral: Description- No Localized lymphadenopathy.      Assessment & Plan:  You appear to have uri presently but may be get bronchitis or sinus infection over  next 2-3 days.  I rx'd flonase for nasal congestion, benzonatate for cough, and azithromycin antibiotic to start if signs or symptoms worsen as discussed.  Follow up in 7 days or as needed

## 2015-06-06 HISTORY — PX: KNEE ARTHROSCOPY: SUR90

## 2015-06-21 ENCOUNTER — Other Ambulatory Visit: Payer: Self-pay

## 2015-06-23 ENCOUNTER — Ambulatory Visit (INDEPENDENT_AMBULATORY_CARE_PROVIDER_SITE_OTHER): Payer: Managed Care, Other (non HMO) | Admitting: Internal Medicine

## 2015-06-23 ENCOUNTER — Encounter: Payer: Self-pay | Admitting: Internal Medicine

## 2015-06-23 VITALS — BP 122/78 | HR 103 | Temp 98.0°F | Ht 72.0 in | Wt 264.4 lb

## 2015-06-23 DIAGNOSIS — F909 Attention-deficit hyperactivity disorder, unspecified type: Secondary | ICD-10-CM

## 2015-06-23 DIAGNOSIS — F988 Other specified behavioral and emotional disorders with onset usually occurring in childhood and adolescence: Secondary | ICD-10-CM

## 2015-06-23 MED ORDER — AMPHETAMINE-DEXTROAMPHET ER 10 MG PO CP24: 10.0000 mg | ORAL_CAPSULE | ORAL | Status: DC

## 2015-06-23 NOTE — Patient Instructions (Signed)
BEFORE YOU LEAVE THE OFFICE: GO TO THE FRONT DESK  Schedule a complete physical exam to be done in 6 months Please be fasting   

## 2015-06-23 NOTE — Progress Notes (Signed)
   Subjective:    Patient ID: MIKEY MAFFETT, male    DOB: [birth year], 35 y.o.   MRN: [id number]  DOS:  06/23/2015 Type of visit - description : Routine checkup Interval history: ADD: Symptoms well controlled with meds as needed. Needs a refill and a contract Back pain: Seen few months ago, saw specialist, had local injections, currently asymptomatic   Review of Systems  no chest pain or difficulty breathing No nausea, vomiting, diarrhea No anxiety depression. No insomnia  Past Medical History  Diagnosis Date  . Tic disorder     Chronic  . ADD (attention deficit disorder)     Past Surgical History  Procedure Laterality Date  . Mri      L5-S1 problem---recvd nerve root block, denied Sx  . Hernia repair    . Tympanostomy tube placement  2004    Social History   Social History  . Marital Status: Married    Spouse Name: N/A  . Number of Children: 2  . Years of Education: N/A   Occupational History  . commercial real estate      Social History Main Topics  . Smoking status: Never Smoker   . Smokeless tobacco: Never Used  . Alcohol Use: Yes     Comment: socially  . Drug Use: No  . Sexual Activity: Not on file   Other Topics Concern  . Not on file   Social History Narrative   Household -- pt, wife, 2 children                    Medication List       This list is accurate as of: 06/23/15 11:59 PM.  Always use your most recent med list.               amphetamine-dextroamphetamine 10 MG 24 hr capsule  Commonly known as:  ADDERALL XR  Take 1 capsule (10 mg total) by mouth every morning.     multivitamin tablet  Take 1 tablet by mouth daily.           Objective:   Physical Exam BP 122/78 mmHg  Pulse 103  Temp(Src) 98 F (36.7 C) (Oral)  Ht 6' (1.829 m)  Wt 264 lb 6 oz (119.92 kg)  BMI 35.85 kg/m2  SpO2 99% General:   Well developed, well nourished . NAD.  HEENT:  Normocephalic . Face symmetric, atraumatic Lungs:  CTA B Normal  respiratory effort, no intercostal retractions, no accessory muscle use. Heart: RRR,  no murmur.  No pretibial edema bilaterally  Skin: Not pale. Not jaundice Neurologic:  alert & oriented X3.  Speech normal, gait appropriate for age and unassisted Psych--  Cognition and judgment appear intact.  Cooperative with normal attention span and concentration.  Behavior appropriate. No anxious or depressed appearing.      Assessment & Plan:   Assessment ADD Tic disorder Back pain: 11/2014, so Ortho, local injections, improved  PLAN: ADD: Well-controlled, contract signed, refills provided 2. Back pain: Resolved RTC 6 months, physical

## 2015-06-23 NOTE — Progress Notes (Signed)
Pre visit review using our clinic review tool, if applicable. No additional management support is needed unless otherwise documented below in the visit note. 

## 2015-11-09 ENCOUNTER — Telehealth: Payer: Self-pay | Admitting: Internal Medicine

## 2015-11-09 MED ORDER — AMPHETAMINE-DEXTROAMPHET ER 10 MG PO CP24: 10.0000 mg | ORAL_CAPSULE | ORAL | Status: DC

## 2015-11-09 NOTE — Telephone Encounter (Signed)
Please inform Pt that Rx's have been placed at front desk to pick up at his convenience. Thank you.

## 2015-11-09 NOTE — Telephone Encounter (Signed)
Pt is requesting refill on Adderall.  Last OV: 06/23/2015 Last Fill: 06/23/2015 #30 and 0RF For January and February 2017 UDS: 03/23/2015 Low risk  Please advise.

## 2015-11-09 NOTE — Telephone Encounter (Signed)
Refill request for amphetamine   CB: [phone]

## 2015-11-09 NOTE — Telephone Encounter (Signed)
Ok RF, 3 prescriptions

## 2015-11-09 NOTE — Telephone Encounter (Signed)
Rx's for June, July and August 2017 printed, awaiting MD signature.

## 2015-11-10 NOTE — Telephone Encounter (Signed)
Called pt. He will be in for pick up.

## 2015-11-13 IMAGING — DX DG CHEST 2V
2 series · 2 of 2 positions shown · non-contrast
Comparison: 07/11/2013

CLINICAL DATA: Shortness of breath.  Upper back contusion.

EXAM:
CHEST  2 VIEW

[chest pa]
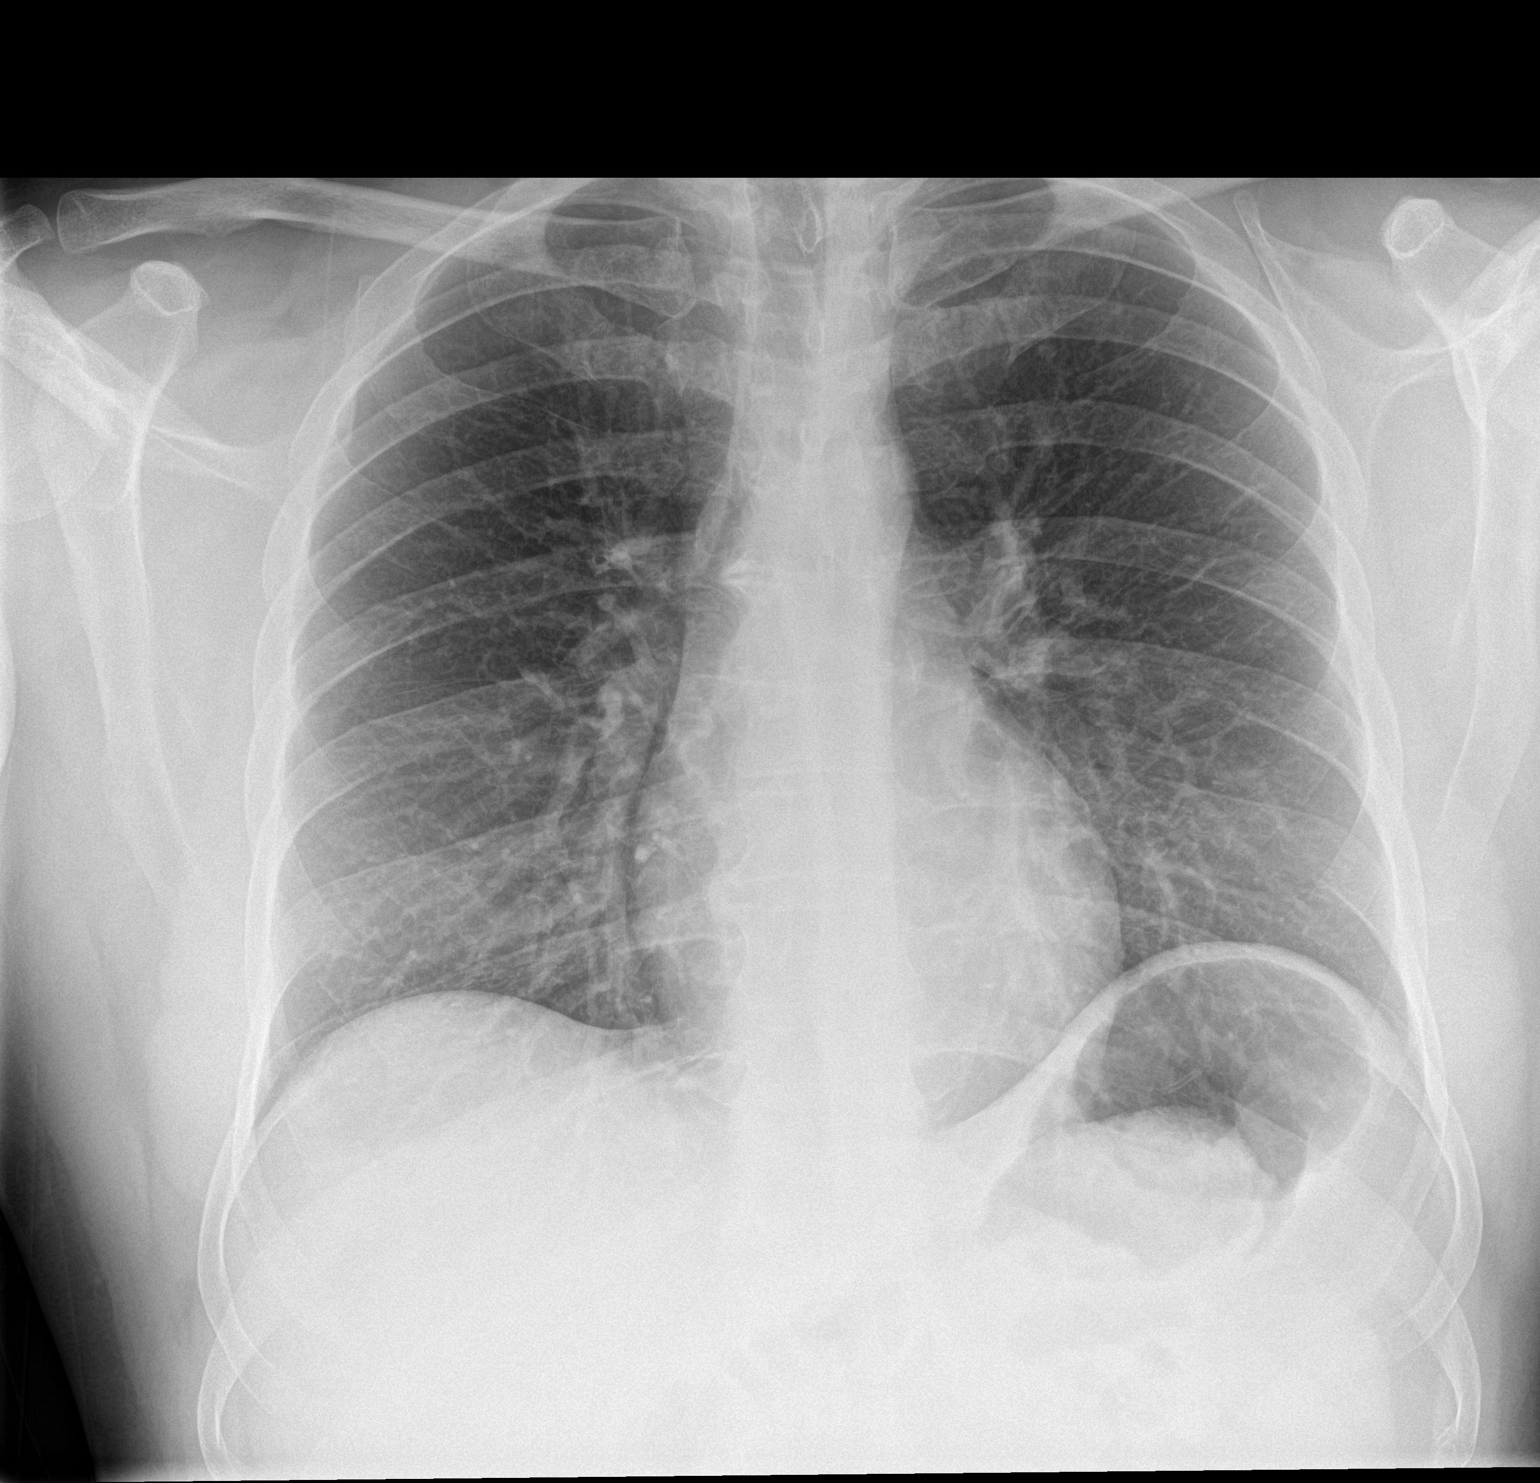

[chest lat]
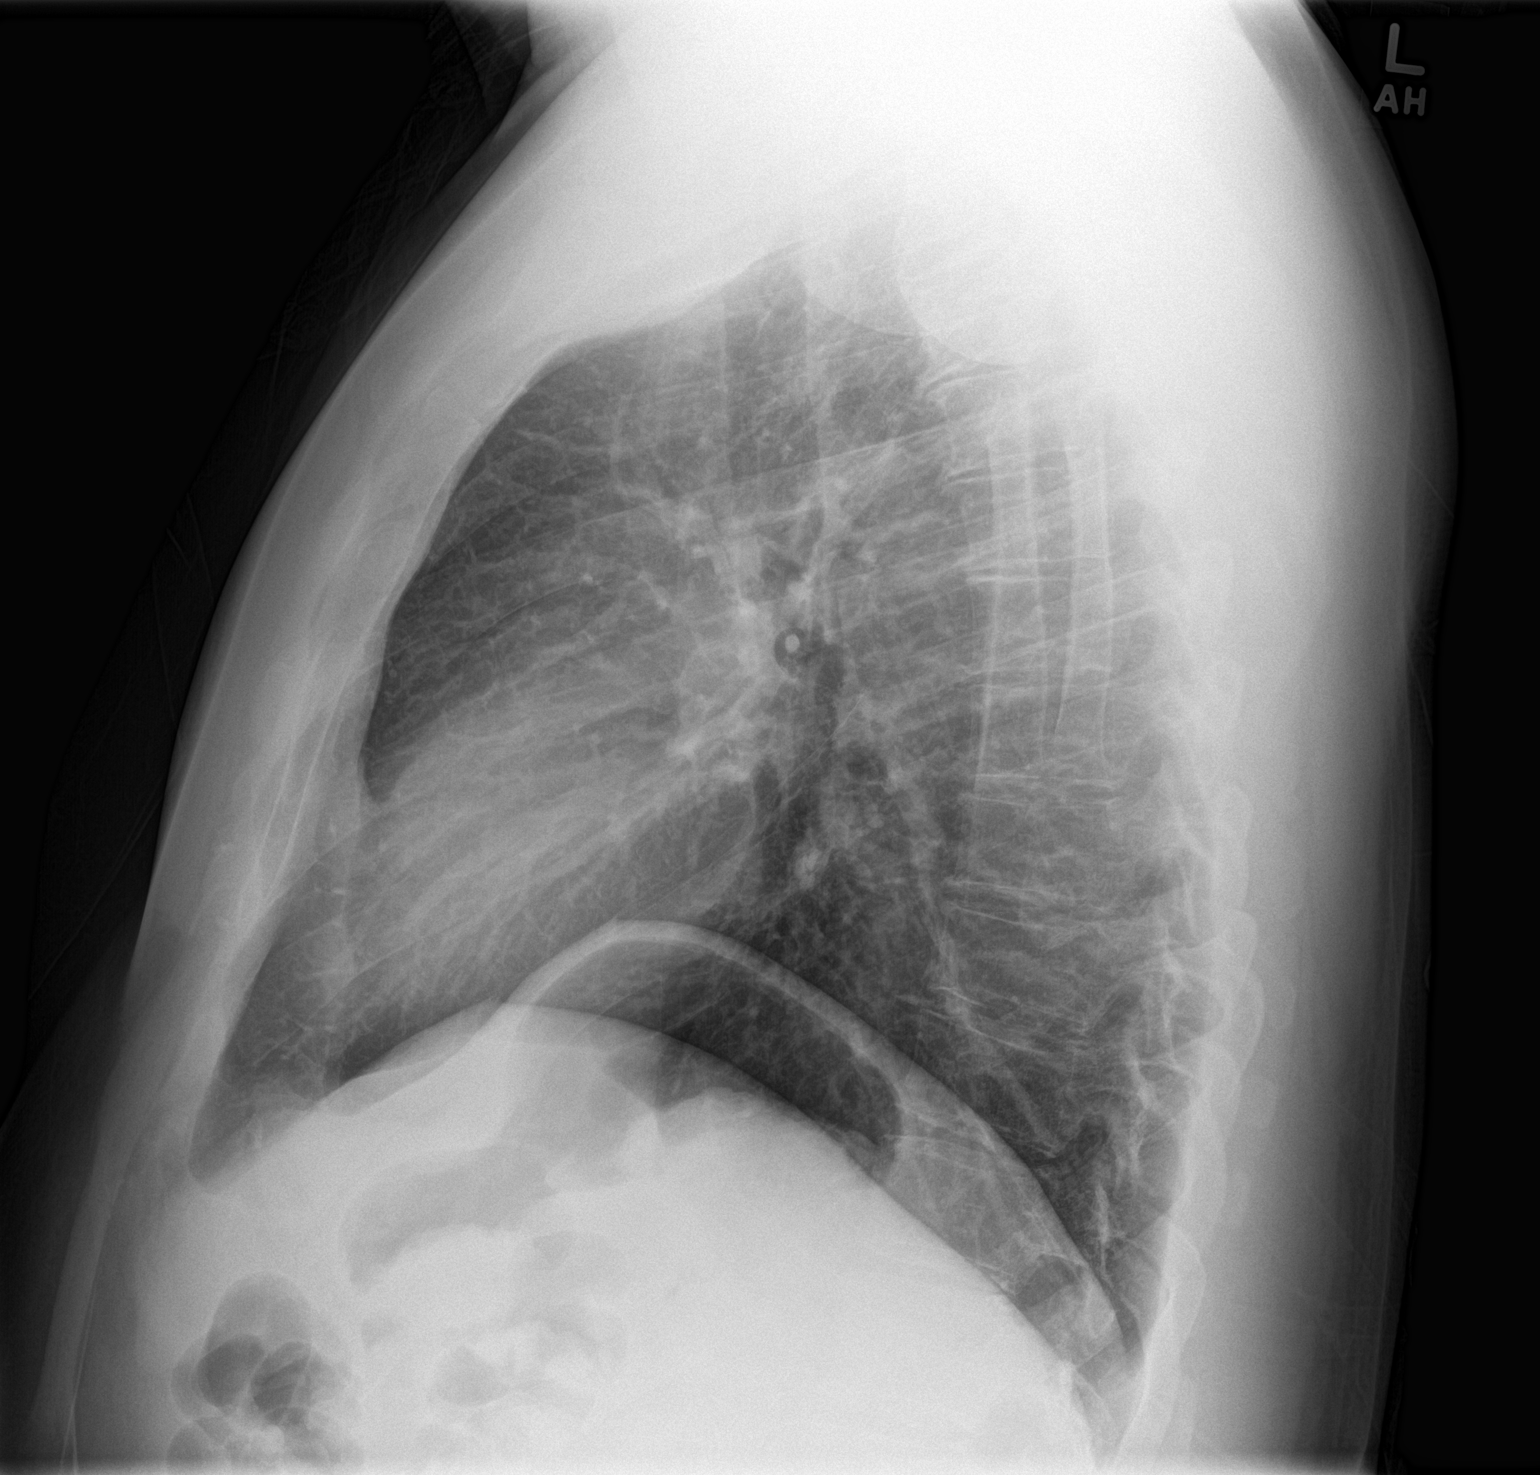

[2 of 2 positions shown; findings below may reference images not displayed]

FINDINGS: The heart size and mediastinal contours are within normal limits.
Both lungs are clear. The visualized skeletal structures are
unremarkable.
IMPRESSION: Normal exam.

## 2015-12-22 ENCOUNTER — Encounter: Payer: Managed Care, Other (non HMO) | Admitting: Internal Medicine

## 2016-01-19 ENCOUNTER — Encounter: Payer: Self-pay | Admitting: Internal Medicine

## 2016-01-19 ENCOUNTER — Ambulatory Visit (INDEPENDENT_AMBULATORY_CARE_PROVIDER_SITE_OTHER): Payer: Managed Care, Other (non HMO) | Admitting: Internal Medicine

## 2016-01-19 VITALS — BP 126/78 | HR 87 | Temp 98.2°F | Resp 12 | Ht 72.0 in | Wt 263.1 lb

## 2016-01-19 DIAGNOSIS — Z0000 Encounter for general adult medical examination without abnormal findings: Secondary | ICD-10-CM

## 2016-01-19 DIAGNOSIS — F988 Other specified behavioral and emotional disorders with onset usually occurring in childhood and adolescence: Secondary | ICD-10-CM

## 2016-01-19 LAB — CBC WITH DIFFERENTIAL/PLATELET
BASOS PCT: 0.6 % (ref 0.0–3.0)
Basophils Absolute: 0 10*3/uL (ref 0.0–0.1)
EOS PCT: 6.9 % — AB (ref 0.0–5.0)
Eosinophils Absolute: 0.3 10*3/uL (ref 0.0–0.7)
HEMATOCRIT: 44.2 % (ref 39.0–52.0)
Hemoglobin: 15.4 g/dL (ref 13.0–17.0)
LYMPHS PCT: 37.9 % (ref 12.0–46.0)
Lymphs Abs: 1.9 10*3/uL (ref 0.7–4.0)
MCHC: 34.9 g/dL (ref 30.0–36.0)
MCV: 89.3 fl (ref 78.0–100.0)
MONO ABS: 0.5 10*3/uL (ref 0.1–1.0)
Monocytes Relative: 9.2 % (ref 3.0–12.0)
Neutro Abs: 2.3 10*3/uL (ref 1.4–7.7)
Neutrophils Relative %: 45.4 % (ref 43.0–77.0)
Platelets: 227 10*3/uL (ref 150.0–400.0)
RBC: 4.95 Mil/uL (ref 4.22–5.81)
RDW: 13.1 % (ref 11.5–15.5)
WBC: 5 10*3/uL (ref 4.0–10.5)

## 2016-01-19 LAB — LIPID PANEL
CHOL/HDL RATIO: 3
Cholesterol: 209 mg/dL — ABNORMAL HIGH (ref 0–200)
HDL: 60.8 mg/dL (ref >39.00)
LDL Cholesterol: 128 mg/dL — ABNORMAL HIGH (ref 0–99)
NonHDL: 148.05
Triglycerides: 99 mg/dL (ref 0.0–149.0)
VLDL: 19.8 mg/dL (ref 0.0–40.0)

## 2016-01-19 LAB — BASIC METABOLIC PANEL
BUN: 18 mg/dL (ref 6–23)
CO2: 30 meq/L (ref 19–32)
Calcium: 9.6 mg/dL (ref 8.4–10.5)
Chloride: 102 mEq/L (ref 96–112)
Creatinine, Ser: 0.89 mg/dL (ref 0.40–1.50)
GFR: 102.51 mL/min (ref >60.00)
Glucose, Bld: 93 mg/dL (ref 70–99)
POTASSIUM: 4.1 meq/L (ref 3.5–5.1)
Sodium: 139 mEq/L (ref 135–145)

## 2016-01-19 MED ORDER — AMPHETAMINE-DEXTROAMPHET ER 10 MG PO CP24: 10.0000 mg | ORAL_CAPSULE | ORAL | 0 refills | Status: DC

## 2016-01-19 NOTE — Progress Notes (Signed)
Pre visit review using our clinic review tool, if applicable. No additional management support is needed unless otherwise documented below in the visit note. 

## 2016-01-19 NOTE — Patient Instructions (Signed)
GO TO THE LAB : Get the blood work  ; provide a urine sample for a UDS   GO TO THE FRONT DESK Schedule your next appointment for a  routine checkup, 8 months

## 2016-01-19 NOTE — Assessment & Plan Note (Addendum)
Td-2010 Labs: BMP, FLP, CBC, HIV   Diet-  lost 20 pounds with a low CHO diet, regaining the weight back. Encourage to go back to a healthier diet. Unable to exercise much due to knee pain, recommend to go back to a more active lifestyle as soon as possible

## 2016-01-19 NOTE — Progress Notes (Signed)
Subjective:    Patient ID: David Buckley, male    DOB: [birth year], 35 y.o.   MRN: [id number]  DOS:  01/19/2016 Type of visit - description :  CPX Interval history: No major concerns, doing well.    Review of Systems Constitutional: No fever. No chills. No unexplained wt changes. No unusual sweats  HEENT: No dental problems, no ear discharge, no facial swelling, no voice changes. No eye discharge, no eye  redness , no  intolerance to light   Respiratory: No wheezing , no  difficulty breathing. No cough , no mucus production  Cardiovascular: No CP, no leg swelling , no  Palpitations  GI: no nausea, no vomiting, no diarrhea , no  abdominal pain.  No blood in the stools. No dysphagia, no odynophagia    Endocrine: No polyphagia, no polyuria , no polydipsia  GU: No dysuria, gross hematuria, difficulty urinating. No urinary urgency, no frequency.  Musculoskeletal: No joint swellings. + Knee pain, follow-up by orthopedic. Skin: No change in the color of the skin, palor , no  Rash  Allergic, immunologic: No environmental allergies , no  food allergies  Neurological: No dizziness no  syncope. No headaches. No diplopia, no slurred, no slurred speech, no motor deficits, no facial  Numbness  Hematological: No enlarged lymph nodes, no easy bruising , no unusual bleedings  Psychiatry: No suicidal ideas, no hallucinations, no beavior problems, no confusion.  No unusual/severe anxiety, no depression   Past Medical History:  Diagnosis Date  . ADD (attention deficit disorder)   . Tic disorder    Chronic    Past Surgical History:  Procedure Laterality Date  . HERNIA REPAIR Left ~ 2009  . MRI     L5-S1 problem---recvd nerve root block, denied Sx  . TYMPANOSTOMY TUBE PLACEMENT  2004    Social History   Social History  . Marital status: Married    Spouse name: N/A  . Number of children: 2  . Years of education: N/A   Occupational History  . commercial real estate       Social History Main Topics  . Smoking status: Never Smoker  . Smokeless tobacco: Never Used  . Alcohol use Yes     Comment: socially  . Drug use: No  . Sexual activity: Not on file   Other Topics Concern  . Not on file   Social History Narrative   Household -- pt, wife, 2 children                 Family History  Problem Relation Age of Onset  . Diabetes Other     GM  . Prostate cancer Neg Hx   . Colon cancer Neg Hx   . Heart attack Neg Hx        Medication List       Accurate as of 01/19/16 11:59 PM. Always use your most recent med list.          amphetamine-dextroamphetamine 10 MG 24 hr capsule Commonly known as:  ADDERALL XR Take 1 capsule (10 mg total) by mouth every morning.   multivitamin tablet Take 1 tablet by mouth daily.          Objective:   Physical Exam BP 126/78 (BP Location: Left Arm, Patient Position: Sitting, Cuff Size: Normal)   Pulse 87   Temp 98.2 F (36.8 C) (Oral)   Resp 12   Ht 6' (1.829 m)   Wt 263 lb 2 oz (119.4 kg)  SpO2 97%   BMI 35.69 kg/m   General:   Well developed, well nourished . NAD.  Neck: No  thyromegaly  HEENT:  Normocephalic . Face symmetric, atraumatic Lungs:  CTA B Normal respiratory effort, no intercostal retractions, no accessory muscle use. Heart: RRR,  no murmur.  No pretibial edema bilaterally  Abdomen:  Not distended, soft, non-tender. No rebound or rigidity.   Skin: Exposed areas without rash. Not pale. Not jaundice Neurologic:  alert & oriented X3.  Speech normal, gait appropriate for age and unassisted Strength symmetric and appropriate for age.  Psych: Cognition and judgment appear intact.  Cooperative with normal attention span and concentration.  Behavior appropriate. No anxious or depressed appearing.    Assessment & Plan:   Assessment ADD Tic disorder Back pain: 11/2014, so Ortho, local injections, improved   PLAN: ADD: Well controlled with current meds, UDS today; using  a generic medication and it has not triggered any tics. RTC 8 months

## 2016-01-20 DIAGNOSIS — Z09 Encounter for follow-up examination after completed treatment for conditions other than malignant neoplasm: Secondary | ICD-10-CM | POA: Insufficient documentation

## 2016-01-20 LAB — HIV ANTIBODY (ROUTINE TESTING W REFLEX): HIV 1&2 Ab, 4th Generation: NONREACTIVE

## 2016-01-20 NOTE — Assessment & Plan Note (Signed)
ADD: Well controlled with current meds, UDS today; using a generic medication and it has not triggered any tics. RTC 8 months

## 2016-01-27 ENCOUNTER — Telehealth: Payer: Self-pay

## 2016-01-27 NOTE — Telephone Encounter (Signed)
UDS: 01/19/2016  Positive for Adderall   Low risk per Dr. Drue NovelPaz 01/27/2016

## 2016-03-21 ENCOUNTER — Ambulatory Visit (INDEPENDENT_AMBULATORY_CARE_PROVIDER_SITE_OTHER): Payer: Managed Care, Other (non HMO)

## 2016-03-21 DIAGNOSIS — Z23 Encounter for immunization: Secondary | ICD-10-CM

## 2016-04-05 NOTE — Procedures (Signed)
error 

## 2016-06-13 ENCOUNTER — Telehealth: Payer: Self-pay | Admitting: Internal Medicine

## 2016-06-13 MED ORDER — AMPHETAMINE-DEXTROAMPHET ER 10 MG PO CP24: 10.0000 mg | ORAL_CAPSULE | ORAL | 0 refills | Status: DC

## 2016-06-13 NOTE — Telephone Encounter (Signed)
Rx's for January and February 2018 printed, awaiting MD signature.  

## 2016-06-13 NOTE — Telephone Encounter (Signed)
Patient called requesting a refill of amphetamine-dextroamphetamine (ADDERALL XR) 10 MG 24 hr capsule Please advise   Phone: [phone]

## 2016-06-13 NOTE — Telephone Encounter (Signed)
Okay #30, 2 prescriptions 

## 2016-06-13 NOTE — Telephone Encounter (Signed)
Pt is requesting refill on Adderall.  Last OV: 01/19/2016 Last Fill: 01/19/2016 #30 and 0RF UDS: 03/23/2015 Low risk  Please advise.

## 2016-06-13 NOTE — Telephone Encounter (Signed)
Spoke w/ Pt, informed that Rx's have been placed at front desk for pick up at his convenience. Pt verbalized understanding.  

## 2016-06-16 ENCOUNTER — Encounter: Payer: Self-pay | Admitting: Internal Medicine

## 2016-06-16 DIAGNOSIS — Z79899 Other long term (current) drug therapy: Secondary | ICD-10-CM | POA: Diagnosis not present

## 2016-06-29 ENCOUNTER — Telehealth: Payer: Self-pay

## 2016-06-29 NOTE — Telephone Encounter (Signed)
UDS: 06/16/2016  Positive for Adderall   Low risk per PCP 06/29/2016

## 2016-07-03 MED FILL — ADDERALL XR 10 MG CAP SA: 10 | 30 days supply | Qty: 30 | Fill #0

## 2016-08-07 ENCOUNTER — Encounter: Payer: Self-pay | Admitting: Internal Medicine

## 2016-08-07 ENCOUNTER — Ambulatory Visit (INDEPENDENT_AMBULATORY_CARE_PROVIDER_SITE_OTHER): Payer: 59 | Admitting: Internal Medicine

## 2016-08-07 VITALS — BP 128/78 | HR 83 | Temp 98.3°F | Resp 14 | Ht 72.0 in | Wt 264.4 lb

## 2016-08-07 DIAGNOSIS — F988 Other specified behavioral and emotional disorders with onset usually occurring in childhood and adolescence: Secondary | ICD-10-CM

## 2016-08-07 NOTE — Patient Instructions (Signed)
   GO TO THE FRONT DESK Schedule your next appointment for a  Physical in 8 months    Call for refills as needed

## 2016-08-07 NOTE — Progress Notes (Signed)
   Subjective:    Patient ID: David Buckley, male    DOB: 1980-08-01, 36 y.o.   MRN: [id number]  DOS:  08/07/2016 Type of visit - description : rov Interval history: ADD, well-controlled with current meds. Cut his finger yesterday, at home, bleeding was controlled quickly. Doing well with diet, after he lost 20 pounds last year he has not gain them back  Wt Readings from Last 3 Encounters:  08/07/16 264 lb 6 oz (119.9 kg)  01/19/16 263 lb 2 oz (119.4 kg)  06/23/15 264 lb 6 oz (119.9 kg)    Review of Systems Denies anxiety, depression. Sleeping well, no insomnia   Past Medical History:  Diagnosis Date  . ADD (attention deficit disorder)   . Tic disorder    Chronic    Past Surgical History:  Procedure Laterality Date  . HERNIA REPAIR Left ~ 2009  . MRI     L5-S1 problem---recvd nerve root block, denied Sx  . TYMPANOSTOMY TUBE PLACEMENT  2004    Social History   Social History  . Marital status: Married    Spouse name: N/A  . Number of children: 2  . Years of education: N/A   Occupational History  . commercial real estate      Social History Main Topics  . Smoking status: Never Smoker  . Smokeless tobacco: Never Used  . Alcohol use Yes     Comment: socially  . Drug use: No  . Sexual activity: Not on file   Other Topics Concern  . Not on file   Social History Narrative   Household -- pt, wife, 2 children                  Allergies as of 08/07/2016   No Known Allergies     Medication List       Accurate as of 08/07/16 11:59 PM. Always use your most recent med list.          amphetamine-dextroamphetamine 10 MG 24 hr capsule Commonly known as:  ADDERALL XR Take 1 capsule (10 mg total) by mouth every morning.   amphetamine-dextroamphetamine 10 MG 24 hr capsule Commonly known as:  ADDERALL XR Take 1 capsule (10 mg total) by mouth every morning.   multivitamin tablet Take 1 tablet by mouth daily.          Objective:   Physical Exam BP  128/78 (BP Location: Left Arm, Patient Position: Sitting, Cuff Size: Normal)   Pulse 83   Temp 98.3 F (36.8 C) (Oral)   Resp 14   Ht 6' (1.829 m)   Wt 264 lb 6 oz (119.9 kg)   SpO2 97%   BMI 35.86 kg/m  General:   Well developed, well nourished . NAD.  HEENT:  Normocephalic . Face symmetric, atraumatic  Skin: Right thumb has a 3/4  centimeter cut the tip, no actual bleeding, redness, swelling or discharge. Neurologic:  alert & oriented X3.  Speech normal, gait appropriate for age and unassisted Psych--  Cognition and judgment appear intact.  Cooperative with normal attention span and concentration.  Behavior appropriate. No anxious or depressed appearing.      Assessment & Plan:   Assessment ADD Tic disorder Back pain: 11/2014, so Ortho, local injections, improved   PLAN: ADD:  Well-controlled, UDS 06-2016 low risk. Call for refills when needed. Injury, right thumb: Patient is up-to-date on Td shots, area was inspected a newa Band-Aid placed. Discuss a healthy diet. RTC 8 months, CPX

## 2016-08-07 NOTE — Progress Notes (Signed)
Pre visit review using our clinic review tool, if applicable. No additional management support is needed unless otherwise documented below in the visit note. 

## 2016-08-08 NOTE — Assessment & Plan Note (Signed)
ADD:  Well-controlled, UDS 06-2016 low risk. Call for refills when needed. Injury, right thumb: Patient is up-to-date on Td shots, area was inspected a newa Band-Aid placed. Discuss a healthy diet. RTC 8 months, CPX

## 2016-09-18 DIAGNOSIS — H6123 Impacted cerumen, bilateral: Secondary | ICD-10-CM | POA: Diagnosis not present

## 2016-09-20 MED FILL — ADDERALL XR 10 MG CAP SA: 10 | 30 days supply | Qty: 30 | Fill #0

## 2016-10-16 DIAGNOSIS — H6691 Otitis media, unspecified, right ear: Secondary | ICD-10-CM | POA: Diagnosis not present

## 2016-10-16 DIAGNOSIS — H6121 Impacted cerumen, right ear: Secondary | ICD-10-CM | POA: Diagnosis not present

## 2016-10-16 DIAGNOSIS — H6061 Unspecified chronic otitis externa, right ear: Secondary | ICD-10-CM | POA: Diagnosis not present

## 2016-10-16 DIAGNOSIS — H902 Conductive hearing loss, unspecified: Secondary | ICD-10-CM | POA: Diagnosis not present

## 2016-10-16 DIAGNOSIS — H7293 Unspecified perforation of tympanic membrane, bilateral: Secondary | ICD-10-CM | POA: Diagnosis not present

## 2016-10-24 DIAGNOSIS — H7293 Unspecified perforation of tympanic membrane, bilateral: Secondary | ICD-10-CM | POA: Diagnosis not present

## 2016-11-09 ENCOUNTER — Telehealth: Payer: Self-pay | Admitting: Internal Medicine

## 2016-11-09 NOTE — Telephone Encounter (Signed)
Ok 2 RXs 

## 2016-11-09 NOTE — Telephone Encounter (Signed)
Pt is requesting refill on Adderall.   Last OV: 08/07/2016 Last Fill: 06/13/2016 #30 and 0RF For January and February 2018 UDS: 06/16/2016 Low risk  Please advise.

## 2016-11-09 NOTE — Telephone Encounter (Addendum)
Relation to OZ:HYQMpt:self Call back number:[phone]   Reason for call:  Patient requesting a refill amphetamine-dextroamphetamine (ADDERALL XR) 10 MG 24 hr capsule

## 2016-11-10 MED ORDER — AMPHETAMINE-DEXTROAMPHET ER 10 MG PO CP24: 10.0000 mg | ORAL_CAPSULE | ORAL | 0 refills | Status: DC

## 2016-11-10 NOTE — Telephone Encounter (Signed)
Rx's for June and July 2018 printed, awaiting MD signature.  

## 2016-11-10 NOTE — Telephone Encounter (Signed)
Please inform Pt that Rx has been placed at front desk for pick up. Thank you.  

## 2016-11-13 DIAGNOSIS — H6983 Other specified disorders of Eustachian tube, bilateral: Secondary | ICD-10-CM | POA: Diagnosis not present

## 2016-11-13 NOTE — Telephone Encounter (Signed)
Patient informed. 

## 2016-11-14 MED FILL — ADDERALL XR 10 MG CAP SA: 10 | 30 days supply | Qty: 30 | Fill #0

## 2017-01-09 MED FILL — ADDERALL XR 10 MG CAP SA: 10 | 30 days supply | Qty: 30 | Fill #0

## 2017-01-25 ENCOUNTER — Encounter: Payer: 59 | Admitting: Internal Medicine

## 2017-02-26 ENCOUNTER — Telehealth: Payer: Self-pay | Admitting: Internal Medicine

## 2017-02-26 NOTE — Telephone Encounter (Signed)
Pt is requesting refill on Adderall.  Last OV: 08/07/2016 Last Fill: 11/10/2016 #30 and 0RF (For June and July 2018) UDS: 06/06/2016 Low risk  Please advise.

## 2017-02-26 NOTE — Telephone Encounter (Signed)
Okay refills 2

## 2017-02-26 NOTE — Telephone Encounter (Signed)
Relation to GM:WNUU Call back [phone]   Reason for call:  Patient requesting a refill amphetamine-dextroamphetamine (ADDERALL XR) 10 MG 24 hr capsule

## 2017-02-27 MED ORDER — AMPHETAMINE-DEXTROAMPHET ER 10 MG PO CP24: 10.0000 mg | ORAL_CAPSULE | ORAL | 0 refills | Status: DC

## 2017-02-27 NOTE — Telephone Encounter (Signed)
Spoke w/ Pt, informed Rx's have been placed at front desk for pick up at his convenience. Pt verbalized understanding.

## 2017-02-27 NOTE — Telephone Encounter (Signed)
Rx printed, awaiting MD signature.   [place] database printed; no issues noted.

## 2017-02-28 MED FILL — ADDERALL XR 10 MG CAP SA: 10 | 30 days supply | Qty: 30 | Fill #0

## 2017-03-20 ENCOUNTER — Encounter: Payer: 59 | Admitting: Internal Medicine

## 2017-03-20 DIAGNOSIS — Z23 Encounter for immunization: Secondary | ICD-10-CM | POA: Diagnosis not present

## 2017-05-09 ENCOUNTER — Ambulatory Visit (INDEPENDENT_AMBULATORY_CARE_PROVIDER_SITE_OTHER): Payer: 59 | Admitting: Internal Medicine

## 2017-05-09 ENCOUNTER — Encounter: Payer: Self-pay | Admitting: Internal Medicine

## 2017-05-09 VITALS — BP 126/74 | HR 90 | Temp 97.8°F | Resp 14 | Ht 72.0 in | Wt 256.1 lb

## 2017-05-09 DIAGNOSIS — Z0000 Encounter for general adult medical examination without abnormal findings: Secondary | ICD-10-CM

## 2017-05-09 MED ORDER — AMPHETAMINE-DEXTROAMPHET ER 10 MG PO CP24: 10.0000 mg | ORAL_CAPSULE | ORAL | 0 refills | Status: DC

## 2017-05-09 MED FILL — ADDERALL XR 10 MG CAP SA: 10 | 30 days supply | Qty: 30 | Fill #0

## 2017-05-09 NOTE — Progress Notes (Signed)
Subjective:    Patient ID: David Buckley, male    DOB: 12-Aug-1980, 36 y.o.   MRN: [id number]  DOS:  05/09/2017 Type of visit - description : cpx Interval history: Good compliance with medications, feels well. No major concerns  Wt Readings from Last 3 Encounters:  05/09/17 256 lb 2 oz (116.2 kg)  08/07/16 264 lb 6 oz (119.9 kg)  01/19/16 263 lb 2 oz (119.4 kg)     Review of Systems  No insomnia, anxiety. A 14 point review of systems is negative    Past Medical History:  Diagnosis Date  . ADD (attention deficit disorder)   . Tic disorder    Chronic    Past Surgical History:  Procedure Laterality Date  . HERNIA REPAIR Left ~ 2009  . KNEE ARTHROSCOPY Right 2017  . MRI     L5-S1 problem---recvd nerve root block, denied Sx  . TYMPANOSTOMY TUBE PLACEMENT  2004    Social History   Socioeconomic History  . Marital status: Married    Spouse name: Not on file  . Number of children: 2  . Years of education: Not on file  . Highest education level: Not on file  Social Needs  . Financial resource strain: Not on file  . Food insecurity - worry: Not on file  . Food insecurity - inability: Not on file  . Transportation needs - medical: Not on file  . Transportation needs - non-medical: Not on file  Occupational History  . Occupation: Multimedia programmercommercial real estate  appraisal  Tobacco Use  . Smoking status: Never Smoker  . Smokeless tobacco: Never Used  Substance and Sexual Activity  . Alcohol use: Yes    Comment: socially  . Drug use: No  . Sexual activity: Not on file  Other Topics Concern  . Not on file  Social History Narrative   Household -- pt, wife, 2 children              Family History  Problem Relation Age of Onset  . Diabetes Other        GM  . Prostate cancer Neg Hx   . Colon cancer Neg Hx   . Heart attack Neg Hx      Allergies as of 05/09/2017   No Known Allergies     Medication List        Accurate as of 05/09/17 11:59 PM. Always use your  most recent med list.          amphetamine-dextroamphetamine 10 MG 24 hr capsule Commonly known as:  ADDERALL XR Take 1 capsule (10 mg total) by mouth every morning.   multivitamin tablet Take 1 tablet by mouth daily.          Objective:   Physical Exam BP 126/74 (BP Location: Left Arm, Patient Position: Sitting, Cuff Size: Normal)   Pulse 90   Temp 97.8 F (36.6 C) (Oral)   Resp 14   Ht 6' (1.829 m)   Wt 256 lb 2 oz (116.2 kg)   SpO2 98%   BMI 34.74 kg/m  General:   Well developed, well nourished . NAD.  Neck: No  thyromegaly  HEENT:  Normocephalic . Face symmetric, atraumatic Lungs:  CTA B Normal respiratory effort, no intercostal retractions, no accessory muscle use. Heart: RRR,  no murmur.  No pretibial edema bilaterally  Abdomen:  Not distended, soft, non-tender. No rebound or rigidity.   Skin: Exposed areas without rash. Not pale. Not jaundice Neurologic:  alert &  oriented X3.  Speech normal, gait appropriate for age and unassisted Strength symmetric and appropriate for age.  Psych: Cognition and judgment appear intact.  Cooperative with normal attention span and concentration.  Behavior appropriate. No anxious or depressed appearing.     Assessment & Plan:    Assessment ADD Tic disorder Back pain: 11/2014, so Ortho, local injections, improved   PLAN: ADD: Good med compliance  , prescription provided. RTC 6 months for Adderall RF.

## 2017-05-09 NOTE — Patient Instructions (Signed)
GO TO THE LAB : Get the blood work     GO TO THE FRONT DESK Schedule your next appointment for a  Check up in 6 months   

## 2017-05-09 NOTE — Progress Notes (Signed)
Pre visit review using our clinic review tool, if applicable. No additional management support is needed unless otherwise documented below in the visit note. 

## 2017-05-09 NOTE — Assessment & Plan Note (Addendum)
-   Td-2010.  Had a flu shot -Labs: CMP, FLP, TSH -Continue to do well with diet, has lost some weight.praised! -Encouraged to stay physically active

## 2017-05-10 LAB — COMPREHENSIVE METABOLIC PANEL
ALT: 37 U/L (ref 0–53)
AST: 22 U/L (ref 0–37)
Albumin: 4.7 g/dL (ref 3.5–5.2)
Alkaline Phosphatase: 63 U/L (ref 39–117)
BUN: 15 mg/dL (ref 6–23)
CO2: 29 mEq/L (ref 19–32)
CREATININE: 0.93 mg/dL (ref 0.40–1.50)
Calcium: 9.4 mg/dL (ref 8.4–10.5)
Chloride: 103 mEq/L (ref 96–112)
GFR: 96.74 mL/min (ref >60.00)
GLUCOSE: 81 mg/dL (ref 70–99)
POTASSIUM: 4.1 meq/L (ref 3.5–5.1)
SODIUM: 140 meq/L (ref 135–145)
Total Bilirubin: 0.6 mg/dL (ref 0.2–1.2)
Total Protein: 7.3 g/dL (ref 6.0–8.3)

## 2017-05-10 LAB — LIPID PANEL
Cholesterol: 184 mg/dL (ref 0–200)
HDL: 55.2 mg/dL (ref >39.00)
LDL Cholesterol: 106 mg/dL — ABNORMAL HIGH (ref 0–99)
NONHDL: 128.34
Total CHOL/HDL Ratio: 3
Triglycerides: 114 mg/dL (ref 0.0–149.0)
VLDL: 22.8 mg/dL (ref 0.0–40.0)

## 2017-05-10 LAB — TSH: TSH: 1.16 u[IU]/mL (ref 0.35–4.50)

## 2017-05-10 NOTE — Assessment & Plan Note (Signed)
ADD: Good med compliance  , prescription provided. RTC 6 months for Adderall RF.

## 2017-07-04 ENCOUNTER — Telehealth: Payer: Self-pay | Admitting: Internal Medicine

## 2017-07-04 MED ORDER — AMPHETAMINE-DEXTROAMPHET ER 10 MG PO CP24: 10.0000 mg | ORAL_CAPSULE | ORAL | 0 refills | Status: DC

## 2017-07-04 NOTE — Telephone Encounter (Signed)
Copied from CRM [phone]. Topic: Quick Communication - Rx Refill/Question >> Jul 04, 2017  9:35 AM Viviann SpareWhite, Selina wrote: Medication: amphetamine-dextroamphetamine (ADDERALL XR) 10 MG 24 hr capsule    Has the patient contacted their pharmacy? Yes.     (Agent: If no, request that the patient contact the pharmacy for the refill.)   Preferred Pharmacy (with phone number or street name):  [facility] [address] - [place], [place] - [address] [place] [address] Phone: [phone] Fax: [phone]     Agent: Please be advised that RX refills may take up to 3 business days. We ask that you follow-up with your pharmacy.

## 2017-07-04 NOTE — Telephone Encounter (Signed)
Pt  Requesting a  Refill of Adderall     LOV 05-09-2017      LAST FILL  DATE 05-09-2017       Pharmacy  Requested   [facility]   [place]

## 2017-07-04 NOTE — Telephone Encounter (Signed)
Sent!

## 2017-07-04 NOTE — Telephone Encounter (Signed)
Pt is requesting refill on Adderall.   Last OV: 05/09/2017 Last Fill: 05/09/2017 #30 and 0RF UDS: 06/14/2016 Low risk  Unable to access [place]CR at this time   Please advise.

## 2017-07-05 NOTE — Telephone Encounter (Signed)
NCCR back up- no issues noted.  

## 2017-07-06 ENCOUNTER — Telehealth: Payer: Self-pay

## 2017-07-06 NOTE — Telephone Encounter (Signed)
PA initiated via Covermymeds; KEY: [id number]. Awaiting determination.

## 2017-07-23 NOTE — Telephone Encounter (Signed)
PA denied for generic dextroamphetamine/amphetamine ER capsules, however plan does cover brand Adderall XR capsules without PA.

## 2017-08-24 ENCOUNTER — Telehealth: Payer: Self-pay | Admitting: Internal Medicine

## 2017-08-24 MED FILL — ADDERALL XR 10 MG CAP SA: 10 | 30 days supply | Qty: 30 | Fill #0

## 2017-08-24 NOTE — Telephone Encounter (Signed)
Pt is requesting refill on Adderall XR 10mg .   Last OV; 05/09/2017 Last Fill: 07/04/2016 #30 and 0RF UDS: 06/16/2016 Low risk   Please advise.

## 2017-08-24 NOTE — Telephone Encounter (Signed)
Sent!

## 2017-09-29 ENCOUNTER — Encounter (HOSPITAL_COMMUNITY): Payer: Self-pay | Admitting: Emergency Medicine

## 2017-09-29 ENCOUNTER — Ambulatory Visit (HOSPITAL_COMMUNITY)
Admission: EM | Admit: 2017-09-29 | Discharge: 2017-09-29 | Disposition: A | Discharge status: Home / Self Care | Payer: 59 | Attending: Family Medicine | Admitting: Family Medicine

## 2017-09-29 DIAGNOSIS — J029 Acute pharyngitis, unspecified: Secondary | ICD-10-CM

## 2017-09-29 LAB — POCT RAPID STREP A: STREPTOCOCCUS, GROUP A SCREEN (DIRECT): NEGATIVE

## 2017-09-29 MED ORDER — AMOXICILLIN 500 MG PO CAPS: 500.0000 mg | ORAL_CAPSULE | Freq: Two times a day (BID) | ORAL | 0 refills | Status: DC

## 2017-09-29 NOTE — ED Provider Notes (Signed)
[facility]    CSN: [id number] Arrival date & time: 09/29/17  1633     History   Chief Complaint Chief Complaint  Patient presents with  . Sore Throat    HPI David Buckley is a 37 y.o. male.   37 year old male comes in for a few hour history of sore throat.  States started out as mild, and now significantly worse.  Having body aches.  Denies fever, chills, night sweats.  Denies cough, nasal congestion, rhinorrhea.  Denies swelling of the throat, trouble breathing, trouble swallowing, tripoding, drooling.  Patient states son is currently being treated for strep.     Past Medical History:  Diagnosis Date  . ADD (attention deficit disorder)   . Tic disorder    Chronic    Patient Active Problem List   Diagnosis Date Noted  . PCP NOTES >>>>>>>>>>>>>>>.. 01/20/2016  . Annual physical exam 06/03/2014  . Obesity 01/24/2011  . TIC DISORDER, UNSPECIFIED 04/08/2007  . Attention deficit disorder 11/08/2006    Past Surgical History:  Procedure Laterality Date  . HERNIA REPAIR Left ~ 2009  . KNEE ARTHROSCOPY Right 2017  . MRI     L5-S1 problem---recvd nerve root block, denied Sx  . TYMPANOSTOMY TUBE PLACEMENT  2004       Home Medications    Prior to Admission medications   Medication Sig Start Date End Date Taking? Authorizing Provider  amoxicillin (AMOXIL) 500 MG capsule Take 1 capsule (500 mg total) by mouth 2 (two) times daily for 10 days. 09/29/17 10/09/17  Belinda Fisher, PA-C  amphetamine-dextroamphetamine (ADDERALL XR) 10 MG 24 hr capsule Take 1 capsule (10 mg total) by mouth every morning. 08/24/17   Wanda Plump, MD  Multiple Vitamin (MULTIVITAMIN) tablet Take 1 tablet by mouth daily.      [provider]    Family History Family History  Problem Relation Age of Onset  . Diabetes Other        GM  . Prostate cancer Neg Hx   . Colon cancer Neg Hx   . Heart attack Neg Hx     Social History Social History   Tobacco Use  . Smoking status:  Never Smoker  . Smokeless tobacco: Never Used  Substance Use Topics  . Alcohol use: Yes    Comment: socially  . Drug use: No     Allergies   Patient has no known allergies.   Review of Systems Review of Systems  Reason unable to perform ROS: See HPI as above.     Physical Exam Triage Vital Signs ED Triage Vitals [09/29/17 1643]  Enc Vitals Group     BP (!) 155/93     Pulse Rate (!) 115     Resp 18     Temp 99.4 F (37.4 C)     Temp Source Oral     SpO2 97 %     Weight      Height      Head Circumference      Peak Flow      Pain Score      Pain Loc      Pain Edu?      Excl. in GC?    No data found.  Updated Vital Signs BP (!) 155/93 (BP Location: Left Arm)   Pulse (!) 115   Temp 99.4 F (37.4 C) (Oral)   Resp 18   SpO2 97%   Physical Exam  Constitutional: He is oriented to person,  place, and time. He appears well-developed and well-nourished. No distress.  HENT:  Head: Normocephalic and atraumatic.  Right Ear: Tympanic membrane, external ear and ear canal normal. Tympanic membrane is not erythematous and not bulging.  Left Ear: Tympanic membrane, external ear and ear canal normal. Tympanic membrane is not erythematous and not bulging.  Nose: Nose normal. Right sinus exhibits no maxillary sinus tenderness and no frontal sinus tenderness. Left sinus exhibits no maxillary sinus tenderness and no frontal sinus tenderness.  Mouth/Throat: Uvula is midline and mucous membranes are normal. Posterior oropharyngeal erythema present. No tonsillar exudate.  Eyes: Pupils are equal, round, and reactive to light. Conjunctivae are normal.  Neck: Normal range of motion. Neck supple.  Cardiovascular: Normal rate, regular rhythm and normal heart sounds. Exam reveals no gallop and no friction rub.  No murmur heard. Pulmonary/Chest: Effort normal and breath sounds normal. He has no decreased breath sounds. He has no wheezes. He has no rhonchi. He has no rales.    Lymphadenopathy:    He has no cervical adenopathy.  Neurological: He is alert and oriented to person, place, and time.  Skin: Skin is warm and dry.  Psychiatric: He has a normal mood and affect. His behavior is normal. Judgment normal.     UC Treatments / Results  Labs (all labs ordered are listed, but only abnormal results are displayed) Labs Reviewed  CULTURE, GROUP A STREP [facility])  POCT RAPID STREP A    EKG None Radiology No results found.  Procedures Procedures (including critical care time)  Medications Ordered in UC Medications - No data to display   Initial Impression / Assessment and Plan / UC Course  I have reviewed the triage vital signs and the nursing notes.  Pertinent labs & imaging results that were available during my care of the patient were reviewed by me and considered in my medical decision making (see chart for details).    Rapid strep negative. However, given sore throat without other symptoms with positive strep contact, will treat empirically for strep with amoxicillin. Discussed with patient symptoms could still be due to viral illness, post nasal drainage. Symptomatic treatment discussed. Return precautions given. Patient expresses understanding and agrees to plan.   Final Clinical Impressions(s) / UC Diagnoses   Final diagnoses:  Pharyngitis, unspecified etiology    ED Discharge Orders        Ordered    amoxicillin (AMOXIL) 500 MG capsule  2 times daily     09/29/17 1656        Belinda Fisher, PA-C 09/29/17 1731

## 2017-09-29 NOTE — Discharge Instructions (Signed)
Rapid strep negative. However, given exposure and exam, will treat empirically for strep with amoxicillin. As discussed, symptoms could also be viral, or due to nasal drainage. You can start over the counter allergy medicine such as zyrtec to help with drainage. Monitor for any worsening of symptoms, swelling of the throat, trouble breathing, trouble swallowing, follow up for reevaluation.   For sore throat try using a honey-based tea. Use 3 teaspoons of honey with juice squeezed from half lemon. Place shaved pieces of ginger into 1/2-1 cup of water and warm over stove top. Then mix the ingredients and repeat every 4 hours as needed.

## 2017-09-29 NOTE — ED Triage Notes (Signed)
Pt sts sore throat x 2 days; pt sts children in home sick with strep this week

## 2017-10-02 LAB — CULTURE, GROUP A STREP (THRC)

## 2017-10-02 NOTE — Progress Notes (Signed)
Negative strep

## 2017-10-04 ENCOUNTER — Ambulatory Visit (INDEPENDENT_AMBULATORY_CARE_PROVIDER_SITE_OTHER): Payer: 59 | Admitting: Medical

## 2017-10-04 ENCOUNTER — Encounter: Payer: Self-pay | Admitting: Medical

## 2017-10-04 VITALS — BP 128/88 | HR 109 | Temp 98.0°F | Resp 16 | Ht 72.0 in | Wt 261.6 lb

## 2017-10-04 DIAGNOSIS — H6690 Otitis media, unspecified, unspecified ear: Secondary | ICD-10-CM | POA: Diagnosis not present

## 2017-10-04 DIAGNOSIS — J029 Acute pharyngitis, unspecified: Secondary | ICD-10-CM | POA: Diagnosis not present

## 2017-10-04 DIAGNOSIS — H109 Unspecified conjunctivitis: Secondary | ICD-10-CM

## 2017-10-04 MED ORDER — CEFTRIAXONE SODIUM 1 G IJ SOLR
1.0000 g | Freq: Once | INTRAMUSCULAR | Status: AC
  Administered 2017-10-04: 1 g via INTRAMUSCULAR

## 2017-10-04 MED ORDER — TOBRAMYCIN 0.3 % OP SOLN: 2.0000 [drp] | Freq: Four times a day (QID) | OPHTHALMIC | 0 refills | Status: DC

## 2017-10-04 MED FILL — TOBRAMYCIN 0.3 % SOLN: 0.3 | 13 days supply | Qty: 5 | Fill #0

## 2017-10-04 NOTE — Addendum Note (Signed)
Addended by: Orlene Och on: 10/04/2017 01:16 PM   Modules accepted: Orders

## 2017-10-04 NOTE — Progress Notes (Signed)
Subjective:    Patient ID: David Buckley, male    DOB: [birth year], 37 y.o.   MRN: [id number]  HPI  Pt in with mild st and enlarged submandibular node, dry cough and rt eye lid swollen.   St for about 5-6 days.   Pt does  Have one child with strep and other with pink eye.  Pt was seen at [facility] and given amoxicillin. On for 5 days. Enlarged lymph nodes not better. Not real fatigue.  Rapid strep was negative. Send out negative  Pt does have dc from rt eye when woke yesterday.  Also left ear pain.  Patient expressed some concern for possible tick bite since he has been hunting recently over the past 2 weeks.  But no known tick found on his body.   Review of Systems  Constitutional: Negative for appetite change, chills, fatigue and fever.  HENT: Positive for congestion, ear pain and sore throat. Negative for postnasal drip, sinus pressure, sinus pain and sneezing.        Blew nose other day dark brown mucus.  Eyes: Positive for discharge.  Respiratory: Negative for cough and chest tightness.   Cardiovascular: Negative for chest pain and palpitations.  Musculoskeletal: Negative for back pain.  Hematological: Negative for adenopathy. Does not bruise/bleed easily.  Psychiatric/Behavioral: Negative for behavioral problems and confusion.    Past Medical History:  Diagnosis Date  . ADD (attention deficit disorder)   . Tic disorder    Chronic     Social History   Socioeconomic History  . Marital status: Married    Spouse name: Not on file  . Number of children: 2  . Years of education: Not on file  . Highest education level: Not on file  Occupational History  . Occupation: Multimedia programmer estate  appraisal  Social Needs  . Financial resource strain: Not on file  . Food insecurity:    Worry: Not on file    Inability: Not on file  . Transportation needs:    Medical: Not on file    Non-medical: Not on file  Tobacco Use  . Smoking status: Never Smoker  . Smokeless  tobacco: Never Used  Substance and Sexual Activity  . Alcohol use: Yes    Comment: socially  . Drug use: No  . Sexual activity: Not on file  Lifestyle  . Physical activity:    Days per week: Not on file    Minutes per session: Not on file  . Stress: Not on file  Relationships  . Social connections:    Talks on phone: Not on file    Gets together: Not on file    Attends religious service: Not on file    Active member of club or organization: Not on file    Attends meetings of clubs or organizations: Not on file    Relationship status: Not on file  . Intimate partner violence:    Fear of current or ex partner: Not on file    Emotionally abused: Not on file    Physically abused: Not on file    Forced sexual activity: Not on file  Other Topics Concern  . Not on file  Social History Narrative   Household -- pt, wife, 2 children             Past Surgical History:  Procedure Laterality Date  . HERNIA REPAIR Left ~ 2009  . KNEE ARTHROSCOPY Right 2017  . MRI     L5-S1 problem---recvd nerve  root block, denied Sx  . TYMPANOSTOMY TUBE PLACEMENT  2004    Family History  Problem Relation Age of Onset  . Diabetes Other        GM  . Prostate cancer Neg Hx   . Colon cancer Neg Hx   . Heart attack Neg Hx     No Known Allergies  Current Outpatient Medications on File Prior to Visit  Medication Sig Dispense Refill  . amoxicillin (AMOXIL) 500 MG capsule Take 1 capsule (500 mg total) by mouth 2 (two) times daily for 10 days. 20 capsule 0  . amphetamine-dextroamphetamine (ADDERALL XR) 10 MG 24 hr capsule Take 1 capsule (10 mg total) by mouth every morning. 30 capsule 0  . Multiple Vitamin (MULTIVITAMIN) tablet Take 1 tablet by mouth daily.       No current facility-administered medications on file prior to visit.     BP 128/88   Pulse (!) 109   Temp 98 F (36.7 C) (Oral)   Resp 16   Ht 6' (1.829 m)   Wt 261 lb 9.6 oz (118.7 kg)   SpO2 97%   BMI 35.48 kg/m         Objective:   Physical Exam  General  Mental Status - Alert. General Appearance - Well groomed. Not in acute distress.  Skin Rashes- No Rashes.  HEENT Head- Normal. Ear Auditory Canal - Left- Normal. Right - Normal.Tympanic Membrane- Left-moderate bright redness in the central portion of TM.Marland Kitchen Right- Normal. Eye Sclera/Conjunctiva- Left- Normal. Right-mild injected conjunctiva.  No matting presently.  Right upper eyelid has small stye. Nose & Sinuses Nasal Mucosa- Left-  Boggy and Congested. Right-  Boggy and  Congested.Bilateral no maxillary and no frontal sinus pressure. Mouth & Throat Lips: Upper Lip- Normal: no dryness, cracking, pallor, cyanosis, or vesicular eruption. Lower Lip-Normal: no dryness, cracking, pallor, cyanosis or vesicular eruption. Buccal Mucosa- Bilateral- No Aphthous ulcers. Oropharynx- No Discharge or Erythema. Tonsils: Characteristics- Bilateral-mild erythema erythema . Size/Enlargement- Bilateral- No enlargement. Discharge- bilateral-None.  Neck Neck- Supple. No Masses.  Mild enlarged submandibular nodes.   Chest and Lung Exam Auscultation: Breath Sounds:-Clear even and unlabored.  Cardiovascular Auscultation:Rythm- Regular, rate and rhythm. Murmurs & Other Heart Sounds:Ausculatation of the heart reveal- No Murmurs.  Lymphatic Head & Neck General Head & Neck Lymphatics: Bilateral: Description-mild enlarged submandibular nodes.  Derm- upon inspection no obvious ticks seen       Assessment & Plan:  You do appear to have a left ear infection presently.  Your rapid strep test was negative the other day and send out culture was also negative.  Though on exam some moderate redness.  Would recommend stopping amoxicillin presently.  We did give the Rocephin 1 g injection for ear infection and this may help your throat as well.  You expressed concern for tick bite recently but no take actually found.  This might make testing for tick bite difficult.   However I can write doxycycline.  This does have some adequate coverage for ear infections, throat infections sinus infections and can treat Lyme/RMSF.  Rx advised and given.  For pinkeye/conjunctivitis prescribed Tobrex.  Follow-up in 7 to 10 days or as needed.  Note initially decided not to do any tick bite study testing due to the fact no reported tick bite occurred and the fact that I was concerned insurance would not cover the test.  At the very end he mentioned that he has a high deductible plan and that the lab would not  be covered anyway.  So I explained to him we will watch how he responds clinically to the antibiotic.  If he does have some persisting sore throat or any recurrent body aches then we could do a lab work-up and could include tick bite antibody studies.  Patient expressed understanding.  Esperanza Richters, PA-C

## 2017-10-04 NOTE — Patient Instructions (Signed)
You do appear to have a left ear infection presently.  Your rapid strep test was negative the other day and send out culture was also negative.  Though on exam some moderate redness.  Would recommend stopping amoxicillin presently.  We did give the Rocephin 1 g injection for ear infection and this may help your throat as well.  You expressed concern for tick bite recently but no take actually found.  This might make testing for tick bite difficult.  However I can write doxycycline.  This does have some adequate coverage for ear infections, throat infections sinus infections and can treat Lyme/RMSF.  Rx advised and given.  For pinkeye/conjunctivitis prescribed Tobrex.  Follow-up in 7 to 10 days or as needed.

## 2017-10-09 ENCOUNTER — Encounter: Payer: Self-pay | Admitting: Medical

## 2017-10-09 ENCOUNTER — Ambulatory Visit (INDEPENDENT_AMBULATORY_CARE_PROVIDER_SITE_OTHER): Payer: 59 | Admitting: Medical

## 2017-10-09 VITALS — BP 154/88 | HR 109 | Temp 98.7°F | Resp 16 | Ht 72.0 in | Wt 262.8 lb

## 2017-10-09 DIAGNOSIS — H9192 Unspecified hearing loss, left ear: Secondary | ICD-10-CM

## 2017-10-09 DIAGNOSIS — H6690 Otitis media, unspecified, unspecified ear: Secondary | ICD-10-CM

## 2017-10-09 MED ORDER — FLUTICASONE PROPIONATE 50 MCG/ACT NA SUSP: 2.0000 | Freq: Every day | NASAL | 0 refills | Status: DC

## 2017-10-09 MED ORDER — LEVOFLOXACIN 500 MG PO TABS: 500.0000 mg | ORAL_TABLET | Freq: Every day | ORAL | 0 refills | Status: DC

## 2017-10-09 MED FILL — levoFLOXacin 500 MG TABS: 500 | 10 days supply | Qty: 10 | Fill #0

## 2017-10-09 MED FILL — FLUTICASONE PROP 50 MCG SPR: 50 | 30 days supply | Qty: 16 | Fill #0

## 2017-10-09 NOTE — Progress Notes (Signed)
Subjective:    Patient ID: David Buckley, male    DOB: [birth year], 37 y.o.   MRN: [id number]  HPI Pt in for slight level  discomfort 1/10 But st and enlarged lymph node are much better/resolved. ST got better after one day.   He has some muffled sounds/decreased hearing. Maybe some mild sinus pressure and nasal congestion.  Did not respond completely to partial course of amoxcillin and then  rocephin im.    Review of Systems  Constitutional: Negative for chills, fatigue and fever.  HENT: Positive for congestion and ear pain. Negative for hearing loss, postnasal drip, sneezing and sore throat.        Mored faint 1/10 discomfort of ear.  Thinks maybe faint sinus pressure.  Respiratory: Negative for cough, chest tightness, shortness of breath and wheezing.   Cardiovascular: Negative for chest pain and palpitations.  Gastrointestinal: Negative for abdominal pain.  Musculoskeletal: Negative for back pain and myalgias.  Neurological: Negative for dizziness and headaches.  Hematological: Negative for adenopathy. Does not bruise/bleed easily.  Psychiatric/Behavioral: Negative for behavioral problems.   Past Medical History:  Diagnosis Date  . ADD (attention deficit disorder)   . Tic disorder    Chronic     Social History   Socioeconomic History  . Marital status: Married    Spouse name: Not on file  . Number of children: 2  . Years of education: Not on file  . Highest education level: Not on file  Occupational History  . Occupation: Multimedia programmer estate  appraisal  Social Needs  . Financial resource strain: Not on file  . Food insecurity:    Worry: Not on file    Inability: Not on file  . Transportation needs:    Medical: Not on file    Non-medical: Not on file  Tobacco Use  . Smoking status: Never Smoker  . Smokeless tobacco: Never Used  Substance and Sexual Activity  . Alcohol use: Yes    Comment: socially  . Drug use: No  . Sexual activity: Not on file    Lifestyle  . Physical activity:    Days per week: Not on file    Minutes per session: Not on file  . Stress: Not on file  Relationships  . Social connections:    Talks on phone: Not on file    Gets together: Not on file    Attends religious service: Not on file    Active member of club or organization: Not on file    Attends meetings of clubs or organizations: Not on file    Relationship status: Not on file  . Intimate partner violence:    Fear of current or ex partner: Not on file    Emotionally abused: Not on file    Physically abused: Not on file    Forced sexual activity: Not on file  Other Topics Concern  . Not on file  Social History Narrative   Household -- pt, wife, 2 children             Past Surgical History:  Procedure Laterality Date  . HERNIA REPAIR Left ~ 2009  . KNEE ARTHROSCOPY Right 2017  . MRI     L5-S1 problem---recvd nerve root block, denied Sx  . TYMPANOSTOMY TUBE PLACEMENT  2004    Family History  Problem Relation Age of Onset  . Diabetes Other        GM  . Prostate cancer Neg Hx   . Colon cancer  Neg Hx   . Heart attack Neg Hx     No Known Allergies  Current Outpatient Medications on File Prior to Visit  Medication Sig Dispense Refill  . amphetamine-dextroamphetamine (ADDERALL XR) 10 MG 24 hr capsule Take 1 capsule (10 mg total) by mouth every morning. 30 capsule 0  . Multiple Vitamin (MULTIVITAMIN) tablet Take 1 tablet by mouth daily.      Marland Kitchen tobramycin (TOBREX) 0.3 % ophthalmic solution Place 2 drops into the right eye every 6 (six) hours. 5 mL 0   No current facility-administered medications on file prior to visit.     BP (!) 154/88   Pulse (!) 109   Temp 98.7 F (37.1 C) (Oral)   Resp 16   Wt 262 lb 12.8 oz (119.2 kg)   SpO2 98%   BMI 35.64 kg/m       Objective:   Physical Exam  General  Mental Status - Alert. General Appearance - Well groomed. Not in acute distress.  Skin Rashes- No Rashes.  HEENT Head-  Normal. Ear Auditory Canal - Left- upper 1/3 portion of tm still bright red. Looks improved. Right - Normal.Tympanic Membrane- Left- Normal. Right- Normal. Eye Sclera/Conjunctiva- Left- Normal. Right- Normal. Nose & Sinuses Nasal Mucosa- Left-  Boggy and Congested. Right-  Boggy and  Congested.Bilateral  No maxillary and no frontal sinus pressure. Mouth & Throat Lips: Upper Lip- Normal: no dryness, cracking, pallor, cyanosis, or vesicular eruption. Lower Lip-Normal: no dryness, cracking, pallor, cyanosis or vesicular eruption. Buccal Mucosa- Bilateral- No Aphthous ulcers. Oropharynx- No Discharge or Erythema. Tonsils: Characteristics- Bilateral-  No Erythema orCongestion. Size/Enlargement- Bilateral- No enlargement. Discharge- bilateral-None.  Neck Neck- Supple. No Masses.   Chest and Lung Exam Auscultation: Breath Sounds:-Clear even and unlabored.  Cardiovascular Auscultation:Rythm- Regular, rate and rhythm. Murmurs & Other Heart Sounds:Ausculatation of the heart reveal- No Murmurs.  Lymphatic Head & Neck General Head & Neck Lymphatics: Bilateral: Description- No Localized lymphadenopathy.       Assessment & Plan:  Your ear looks some better but not completely and hearing still decreased. After discussion decided to give you levofloxin for resistant infection. Benefits vs risk discussed.   Also adding flonase nasal spray.  Please give me update in 10 days how ear feels and if hearing returned to normal. If not completely better may need ENT evaluation.

## 2017-10-09 NOTE — Patient Instructions (Signed)
Your ear looks some better but not completely and hearing still decreased. After discussion decided to give you levofloxin for resistant infection. Benefits vs risk discussed.   Also adding flonase nasal spray.  Please give me update in 10 days how ear feels and if hearing returned to normal. If not completely better may need ENT evaluation.

## 2017-10-22 ENCOUNTER — Telehealth: Payer: Self-pay | Admitting: Internal Medicine

## 2017-10-22 MED ORDER — AMPHETAMINE-DEXTROAMPHET ER 10 MG PO CP24: 10.0000 mg | ORAL_CAPSULE | Freq: Every day | ORAL | 0 refills | Status: DC

## 2017-10-22 MED FILL — ADDERALL XR 10 MG CAP SA: 10 | 30 days supply | Qty: 30 | Fill #0

## 2017-10-22 NOTE — Telephone Encounter (Signed)
Sent!

## 2017-10-22 NOTE — Telephone Encounter (Signed)
Pt is requesting refill on Adderall XR .   Last OV: 10/09/2017 w/ Edward Last Fill: 08/24/2017 #30 and 1OX UDS: 06/14/2016 Low risk  NCCR printed- no discrepancies noted- sent for scanning.   Please advise.

## 2017-11-12 ENCOUNTER — Encounter: Payer: Self-pay | Admitting: Internal Medicine

## 2017-11-12 ENCOUNTER — Ambulatory Visit (INDEPENDENT_AMBULATORY_CARE_PROVIDER_SITE_OTHER): Payer: 59 | Admitting: Internal Medicine

## 2017-11-12 VITALS — BP 136/92 | HR 99 | Temp 98.6°F | Resp 16 | Ht 72.0 in | Wt 264.8 lb

## 2017-11-12 DIAGNOSIS — Z3009 Encounter for other general counseling and advice on contraception: Secondary | ICD-10-CM

## 2017-11-12 DIAGNOSIS — L989 Disorder of the skin and subcutaneous tissue, unspecified: Secondary | ICD-10-CM | POA: Diagnosis not present

## 2017-11-12 DIAGNOSIS — F988 Other specified behavioral and emotional disorders with onset usually occurring in childhood and adolescence: Secondary | ICD-10-CM | POA: Diagnosis not present

## 2017-11-12 NOTE — Assessment & Plan Note (Signed)
ADD: Well-controlled, UDS and contract today.  RF as needed Vasectomy: Refer to urology, how the process works discussed with the patient.  He knows this is nonreversible surgery. Skin lesion: Low suspicious for a malignancy but he is quite concerned, refer to dermatology RTC 6 months

## 2017-11-12 NOTE — Patient Instructions (Addendum)
GO TO THE LAB : provide a urine sample   GO TO THE FRONT DESK Schedule your next appointment for a for a physical exam in 6 months  If you do not hear from the referral to the dermatologist and urologist please let us know

## 2017-11-12 NOTE — Progress Notes (Signed)
Subjective:    Patient ID: David Buckley, male    DOB: [birth year], 37 y.o.   MRN: [id number]  DOS:  11/12/2017 Type of visit - description : f/u Interval history: ADD: Good compliance of medication, no apparent side effects.  Good results Needs a referral for a vasectomy Also, has a skin lesion on the right side of the nose, has been more noticeable lately.   Review of Systems Denies anxiety, depression or difficulty sleeping  Past Medical History:  Diagnosis Date  . ADD (attention deficit disorder)   . Tic disorder    Chronic    Past Surgical History:  Procedure Laterality Date  . HERNIA REPAIR Left ~ 2009  . KNEE ARTHROSCOPY Right 2017  . MRI     L5-S1 problem---recvd nerve root block, denied Sx  . TYMPANOSTOMY TUBE PLACEMENT  2004    Social History   Socioeconomic History  . Marital status: Married    Spouse name: Not on file  . Number of children: 2  . Years of education: Not on file  . Highest education level: Not on file  Occupational History  . Occupation: Multimedia programmer estate  appraisal  Social Needs  . Financial resource strain: Not on file  . Food insecurity:    Worry: Not on file    Inability: Not on file  . Transportation needs:    Medical: Not on file    Non-medical: Not on file  Tobacco Use  . Smoking status: Never Smoker  . Smokeless tobacco: Never Used  Substance and Sexual Activity  . Alcohol use: Yes    Comment: socially  . Drug use: No  . Sexual activity: Not on file  Lifestyle  . Physical activity:    Days per week: Not on file    Minutes per session: Not on file  . Stress: Not on file  Relationships  . Social connections:    Talks on phone: Not on file    Gets together: Not on file    Attends religious service: Not on file    Active member of club or organization: Not on file    Attends meetings of clubs or organizations: Not on file    Relationship status: Not on file  . Intimate partner violence:    Fear of current or  ex partner: Not on file    Emotionally abused: Not on file    Physically abused: Not on file    Forced sexual activity: Not on file  Other Topics Concern  . Not on file  Social History Narrative   Household -- pt, wife, 2 children               Allergies as of 11/12/2017   No Known Allergies     Medication List        Accurate as of 11/12/17  6:08 PM. Always use your most recent med list.          amphetamine-dextroamphetamine 10 MG 24 hr capsule Commonly known as:  ADDERALL XR Take 1 capsule (10 mg total) by mouth every morning.   amphetamine-dextroamphetamine 10 MG 24 hr capsule Commonly known as:  ADDERALL XR Take 1 capsule (10 mg total) by mouth daily.   fluticasone 50 MCG/ACT nasal spray Commonly known as:  FLONASE Place 2 sprays into both nostrils daily.   multivitamin tablet Take 1 tablet by mouth daily.          Objective:   Physical Exam BP (!) 136/92 (BP  Location: Right Arm, Patient Position: Sitting, Cuff Size: Large)   Pulse 99   Temp 98.6 F (37 C) (Oral)   Resp 16   Ht 6' (1.829 m)   Wt 264 lb 12.8 oz (120.1 kg)   SpO2 98%   BMI 35.91 kg/m  General:   Well developed, well nourished . NAD.  HEENT:  Normocephalic . Face symmetric, atraumatic Lungs:  CTA B Normal respiratory effort, no intercostal retractions, no accessory muscle use. Heart: RRR,  no murmur.  No pretibial edema bilaterally  Skin: Right side of the nose has a 3 mm area that is red, hypervascular?. Neurologic:  alert & oriented X3.  Speech normal, gait appropriate for age and unassisted Psych--  Cognition and judgment appear intact.  Cooperative with normal attention span and concentration.  Behavior appropriate. No anxious or depressed appearing.      Assessment & Plan:    Assessment ADD Tic disorder Back pain: 11/2014, so Ortho, local injections, improved   PLAN: ADD: Well-controlled, UDS and contract today.  RF as needed Vasectomy: Refer to urology, how  the process works discussed with the patient.  He knows this is nonreversible surgery. Skin lesion: Low suspicious for a malignancy but he is quite concerned, refer to dermatology RTC 6 months

## 2017-11-15 LAB — PAIN MGMT, PROFILE 8 W/CONF, U
6 Acetylmorphine: NEGATIVE ng/mL (ref <10)
ALCOHOL METABOLITES: POSITIVE ng/mL — AB (ref <500)
AMPHETAMINE: 2643 ng/mL — AB (ref <250)
Amphetamines: POSITIVE ng/mL — AB (ref <500)
BUPRENORPHINE, URINE: NEGATIVE ng/mL (ref <5)
Benzodiazepines: NEGATIVE ng/mL (ref <100)
COCAINE METABOLITE: NEGATIVE ng/mL (ref <150)
Creatinine: 156.2 mg/dL
Ethyl Glucuronide (ETG): 39282 ng/mL — ABNORMAL HIGH (ref <500)
Ethyl Sulfate (ETS): 7325 ng/mL — ABNORMAL HIGH (ref <100)
MARIJUANA METABOLITE: NEGATIVE ng/mL (ref <20)
MDMA: NEGATIVE ng/mL (ref <500)
METHAMPHETAMINE: NEGATIVE ng/mL (ref <250)
OXIDANT: NEGATIVE ug/mL (ref <200)
Opiates: NEGATIVE ng/mL (ref <100)
Oxycodone: NEGATIVE ng/mL (ref <100)
PH: 6.77 (ref 4.5–9.0)

## 2017-12-14 MED FILL — ADDERALL XR 10 MG CAP SA: 10 | 30 days supply | Qty: 30 | Fill #0

## 2018-01-17 DIAGNOSIS — Z3009 Encounter for other general counseling and advice on contraception: Secondary | ICD-10-CM | POA: Diagnosis not present

## 2018-01-31 ENCOUNTER — Other Ambulatory Visit: Payer: Self-pay | Admitting: Internal Medicine

## 2018-01-31 NOTE — Telephone Encounter (Signed)
Last RX:10-22-17  Last OV:11-12-17  Next OV:05-14-18 UDS:low risk 11-12-17  CSC: CSR:

## 2018-02-01 MED ORDER — AMPHETAMINE-DEXTROAMPHET ER 10 MG PO CP24: 10.0000 mg | ORAL_CAPSULE | Freq: Every day | ORAL | 0 refills | Status: DC | PRN

## 2018-02-01 NOTE — Telephone Encounter (Signed)
Sent x2  

## 2018-02-08 MED FILL — ADDERALL XR 10 MG CAP SA: 10 | 30 days supply | Qty: 30 | Fill #0

## 2018-03-05 HISTORY — PX: VASECTOMY: SHX75

## 2018-03-28 MED FILL — diazePAM 10 MG TABS: 10 | 1 days supply | Qty: 1 | Fill #0

## 2018-03-29 DIAGNOSIS — Z302 Encounter for sterilization: Secondary | ICD-10-CM | POA: Diagnosis not present

## 2018-03-29 MED FILL — HYDROCODON-APAP 5-325: 5-325 | 2 days supply | Qty: 12 | Fill #0

## 2018-04-03 MED FILL — ADDERALL XR 10 MG CAP SA: 10 | 30 days supply | Qty: 30 | Fill #0

## 2018-05-14 ENCOUNTER — Encounter: Payer: Self-pay | Admitting: Internal Medicine

## 2018-05-14 ENCOUNTER — Ambulatory Visit (INDEPENDENT_AMBULATORY_CARE_PROVIDER_SITE_OTHER): Payer: 59 | Admitting: Internal Medicine

## 2018-05-14 VITALS — BP 128/78 | HR 79 | Temp 98.1°F | Resp 16 | Ht 72.0 in | Wt 273.5 lb

## 2018-05-14 DIAGNOSIS — Z0000 Encounter for general adult medical examination without abnormal findings: Secondary | ICD-10-CM

## 2018-05-14 DIAGNOSIS — F988 Other specified behavioral and emotional disorders with onset usually occurring in childhood and adolescence: Secondary | ICD-10-CM | POA: Diagnosis not present

## 2018-05-14 LAB — COMPREHENSIVE METABOLIC PANEL
ALBUMIN: 4.6 g/dL (ref 3.5–5.2)
ALT: 47 U/L (ref 0–53)
AST: 25 U/L (ref 0–37)
Alkaline Phosphatase: 64 U/L (ref 39–117)
BUN: 18 mg/dL (ref 6–23)
CO2: 31 meq/L (ref 19–32)
CREATININE: 0.94 mg/dL (ref 0.40–1.50)
Calcium: 9.5 mg/dL (ref 8.4–10.5)
Chloride: 103 mEq/L (ref 96–112)
GFR: 95.05 mL/min (ref >60.00)
GLUCOSE: 98 mg/dL (ref 70–99)
POTASSIUM: 4.9 meq/L (ref 3.5–5.1)
SODIUM: 139 meq/L (ref 135–145)
Total Bilirubin: 0.5 mg/dL (ref 0.2–1.2)
Total Protein: 7.4 g/dL (ref 6.0–8.3)

## 2018-05-14 LAB — CBC WITH DIFFERENTIAL/PLATELET
BASOS PCT: 0.9 % (ref 0.0–3.0)
Basophils Absolute: 0 10*3/uL (ref 0.0–0.1)
EOS PCT: 3.4 % (ref 0.0–5.0)
Eosinophils Absolute: 0.1 10*3/uL (ref 0.0–0.7)
HCT: 43.1 % (ref 39.0–52.0)
Hemoglobin: 14.9 g/dL (ref 13.0–17.0)
Lymphocytes Relative: 36 % (ref 12.0–46.0)
Lymphs Abs: 1.5 10*3/uL (ref 0.7–4.0)
MCHC: 34.6 g/dL (ref 30.0–36.0)
MCV: 91 fl (ref 78.0–100.0)
Monocytes Absolute: 0.5 10*3/uL (ref 0.1–1.0)
Monocytes Relative: 12.4 % — ABNORMAL HIGH (ref 3.0–12.0)
NEUTROS ABS: 2 10*3/uL (ref 1.4–7.7)
Neutrophils Relative %: 47.3 % (ref 43.0–77.0)
PLATELETS: 238 10*3/uL (ref 150.0–400.0)
RBC: 4.73 Mil/uL (ref 4.22–5.81)
RDW: 13.1 % (ref 11.5–15.5)
WBC: 4.2 10*3/uL (ref 4.0–10.5)

## 2018-05-14 LAB — LIPID PANEL
CHOL/HDL RATIO: 4
Cholesterol: 205 mg/dL — ABNORMAL HIGH (ref 0–200)
HDL: 48.6 mg/dL (ref >39.00)
LDL Cholesterol: 127 mg/dL — ABNORMAL HIGH (ref 0–99)
NONHDL: 156.68
Triglycerides: 148 mg/dL (ref 0.0–149.0)
VLDL: 29.6 mg/dL (ref 0.0–40.0)

## 2018-05-14 LAB — TSH: TSH: 1.22 u[IU]/mL (ref 0.35–4.50)

## 2018-05-14 LAB — HEMOGLOBIN A1C: Hgb A1c MFr Bld: 5.4 % (ref 4.6–6.5)

## 2018-05-14 MED ORDER — AMPHETAMINE-DEXTROAMPHET ER 10 MG PO CP24: 10.0000 mg | ORAL_CAPSULE | Freq: Every day | ORAL | 0 refills | Status: DC | PRN

## 2018-05-14 MED FILL — ADDERALL XR 10 MG CAP SA: 10 | 30 days supply | Qty: 30 | Fill #0

## 2018-05-14 NOTE — Progress Notes (Signed)
Subjective:    Patient ID: David Buckley, male    DOB: 03-15-1981, 37 y.o.   MRN: [id number]  DOS:  05/14/2018 Type of visit - description :  cpx In general feeling well  Wt Readings from Last 3 Encounters:  05/14/18 273 lb 8 oz (124.1 kg)  11/12/17 264 lb 12.8 oz (120.1 kg)  10/09/17 262 lb 12.8 oz (119.2 kg)    Review of Systems For the last few months having pain near the elbow, worse in the right, slightly distal from the lateral epicondyles.  Other than above, a 14 point review of systems is negative    Past Medical History:  Diagnosis Date  . ADD (attention deficit disorder)   . Tic disorder    Chronic    Past Surgical History:  Procedure Laterality Date  . HERNIA REPAIR Left ~ 2009  . KNEE ARTHROSCOPY Right 2017  . MRI     L5-S1 problem---recvd nerve root block, denied Sx  . TYMPANOSTOMY TUBE PLACEMENT  2004  . VASECTOMY  03/2018    Social History   Socioeconomic History  . Marital status: Married    Spouse name: Not on file  . Number of children: 2  . Years of education: Not on file  . Highest education level: Not on file  Occupational History  . Occupation: Multimedia programmercommercial real estate  appraisal  Social Needs  . Financial resource strain: Not on file  . Food insecurity:    Worry: Not on file    Inability: Not on file  . Transportation needs:    Medical: Not on file    Non-medical: Not on file  Tobacco Use  . Smoking status: Never Smoker  . Smokeless tobacco: Never Used  Substance and Sexual Activity  . Alcohol use: Yes    Comment: socially  . Drug use: No  . Sexual activity: Not on file  Lifestyle  . Physical activity:    Days per week: Not on file    Minutes per session: Not on file  . Stress: Not on file  Relationships  . Social connections:    Talks on phone: Not on file    Gets together: Not on file    Attends religious service: Not on file    Active member of club or organization: Not on file    Attends meetings of clubs or  organizations: Not on file    Relationship status: Not on file  . Intimate partner violence:    Fear of current or ex partner: Not on file    Emotionally abused: Not on file    Physically abused: Not on file    Forced sexual activity: Not on file  Other Topics Concern  . Not on file  Social History Narrative   Household -- pt, wife, 2 children              Family History  Problem Relation Age of Onset  . Diabetes Other        GM  . Prostate cancer Neg Hx   . Colon cancer Neg Hx   . Heart attack Neg Hx      Allergies as of 05/14/2018   No Known Allergies     Medication List        Accurate as of 05/14/18 11:59 PM. Always use your most recent med list.          amphetamine-dextroamphetamine 10 MG 24 hr capsule Commonly known as:  ADDERALL XR Take 1 capsule (10  mg total) by mouth daily as needed.   fluticasone 50 MCG/ACT nasal spray Commonly known as:  FLONASE Place 2 sprays into both nostrils daily.   multivitamin tablet Take 1 tablet by mouth daily.           Objective:   Physical Exam BP 128/78 (BP Location: Left Arm, Patient Position: Sitting, Cuff Size: Normal)   Pulse 79   Temp 98.1 F (36.7 C) (Oral)   Resp 16   Ht 6' (1.829 m)   Wt 273 lb 8 oz (124.1 kg)   SpO2 98%   BMI 37.09 kg/m  General: Well developed, NAD, BMI noted Neck: No  thyromegaly  HEENT:  Normocephalic . Face symmetric, atraumatic Lungs:  CTA B Normal respiratory effort, no intercostal retractions, no accessory muscle use. Heart: RRR,  no murmur.  No pretibial edema bilaterally  Abdomen:  Not distended, soft, non-tender. No rebound or rigidity. MSK: Left elbow: Slightly TTP at the lateral epicondyle Right elbow: Slightly TTP distal from the lateral epicondyles Skin: Exposed areas without rash. Not pale. Not jaundice Neurologic:  alert & oriented X3.  Speech normal, gait appropriate for age and unassisted Strength symmetric and appropriate for age.   Psych: Cognition and judgment appear intact.  Cooperative with normal attention span and concentration.  Behavior appropriate. No anxious or depressed appearing.     Assessment & Plan:    Assessment ADD Tic disorder Back pain: 11/2014, so Ortho, local injections, improved   PLAN: ADD: On Adderall, Rx sent. Elbow pain: As described above, tennis elbow?.  Recommend a trial with a brace, if not better he will let me know for a referral RTC 6 months

## 2018-05-14 NOTE — Progress Notes (Signed)
Pre visit review using our clinic review tool, if applicable. No additional management support is needed unless otherwise documented below in the visit note. 

## 2018-05-14 NOTE — Patient Instructions (Signed)
GO TO THE LAB : Get the blood work     GO TO THE FRONT DESK Schedule your next appointment for a  Check up in 6 months   

## 2018-05-14 NOTE — Assessment & Plan Note (Addendum)
-   Td-2010.  Had a flu shot -Labs: CMP, FLP, CBC, A1c, TSH -Diet and exercise extensively discussed, BMI is 38, he is concerned.  Main problem is portion control, diet was discussed, calorie counting?.  Reports that he feels he has all information he needs to eat better.  Recommend to start exercising only after he lost some weight.

## 2018-05-15 NOTE — Assessment & Plan Note (Signed)
ADD: On Adderall, Rx sent. Elbow pain: As described above, tennis elbow?.  Recommend a trial with a brace, if not better he will let me know for a referral RTC 6 months

## 2018-06-26 ENCOUNTER — Telehealth: Payer: Self-pay | Admitting: Internal Medicine

## 2018-06-26 MED ORDER — AMPHETAMINE-DEXTROAMPHET ER 10 MG PO CP24: ORAL_CAPSULE | ORAL | 0 refills | Status: DC

## 2018-06-26 MED FILL — ADDERALL XR 10 MG CAP SA: 10 | 30 days supply | Qty: 30 | Fill #0

## 2018-06-26 NOTE — Telephone Encounter (Signed)
Pt is requesting refill on Adderall.   Last OV: 05/14/2018 Last Fill: 05/14/2018 #30 and 0RF UDS: 11/12/2017 Low risk  NCCR printed- no discrepancies noted- sent for scanning.

## 2018-06-26 NOTE — Telephone Encounter (Signed)
Sent!

## 2018-08-21 MED FILL — ADDERALL XR 10 MG CAP SA: 10 | 30 days supply | Qty: 30 | Fill #0

## 2018-09-17 ENCOUNTER — Telehealth: Payer: Self-pay | Admitting: Internal Medicine

## 2018-09-17 MED FILL — ADDERALL XR 10 MG CAP SA: 10 | 30 days supply | Qty: 30 | Fill #0

## 2018-09-17 NOTE — Telephone Encounter (Signed)
Refill request for Adderall  Last OV: 05/14/2018 Last Fill: 06/26/2018 #30 and 0RF UDS: 11/12/2017 Low risk

## 2018-09-17 NOTE — Telephone Encounter (Signed)
Sent!

## 2018-11-13 ENCOUNTER — Other Ambulatory Visit: Payer: Self-pay

## 2018-11-13 ENCOUNTER — Ambulatory Visit (INDEPENDENT_AMBULATORY_CARE_PROVIDER_SITE_OTHER): Payer: 59 | Admitting: Internal Medicine

## 2018-11-13 DIAGNOSIS — F988 Other specified behavioral and emotional disorders with onset usually occurring in childhood and adolescence: Secondary | ICD-10-CM

## 2018-11-13 NOTE — Assessment & Plan Note (Signed)
ADD: Doing well, work is slow consequently he has not needed Adderall on a regular basis.  He has plenty of medicines at home.  Will call for refill when needed. COVID-19: Following good precautions, physically and emotionally feeling well Preventive care: Recommend a flu shot earlier this season RTC 05/2019 CPX, will call and schedule

## 2018-11-13 NOTE — Progress Notes (Signed)
Subjective:    Patient ID: David Buckley, male    DOB: [birth year], 38 y.o.   MRN: [id number]  DOS:  11/13/2018 Type of visit - description:  Virtual Visit via Video Note  I connected with@ on 11/13/18 at  9:20 AM EDT by a video enabled telemedicine application and verified that I am speaking with the correct person using two identifiers.   THIS ENCOUNTER IS A VIRTUAL VISIT DUE TO COVID-19 - PATIENT WAS NOT SEEN IN THE OFFICE. PATIENT HAS CONSENTED TO VIRTUAL VISIT / TELEMEDICINE VISIT   Location of patient: home  Location of provider: office  I discussed the limitations of evaluation and management by telemedicine and the availability of in person appointments. The patient expressed understanding and agreed to proceed.  History of Present Illness: Routine office visit Since the last office visit he is doing well. Taking good precautions regards COVID-19. Work has been a slow, he is spending a lot of time with his children and is trying to remain active.   Review of Systems Denies fever chills No chest pain no difficulty breathing No cough No anxiety or depression  Past Medical History:  Diagnosis Date  . ADD (attention deficit disorder)   . Tic disorder    Chronic    Past Surgical History:  Procedure Laterality Date  . HERNIA REPAIR Left ~ 2009  . KNEE ARTHROSCOPY Right 2017  . MRI     L5-S1 problem---recvd nerve root block, denied Sx  . TYMPANOSTOMY TUBE PLACEMENT  2004  . VASECTOMY  03/2018    Social History   Socioeconomic History  . Marital status: Married    Spouse name: Not on file  . Number of children: 2  . Years of education: Not on file  . Highest education level: Not on file  Occupational History  . Occupation: Adult nurse estate  appraisal  Social Needs  . Financial resource strain: Not on file  . Food insecurity:    Worry: Not on file    Inability: Not on file  . Transportation needs:    Medical: Not on file    Non-medical: Not on  file  Tobacco Use  . Smoking status: Never Smoker  . Smokeless tobacco: Never Used  Substance and Sexual Activity  . Alcohol use: Yes    Comment: socially  . Drug use: No  . Sexual activity: Not on file  Lifestyle  . Physical activity:    Days per week: Not on file    Minutes per session: Not on file  . Stress: Not on file  Relationships  . Social connections:    Talks on phone: Not on file    Gets together: Not on file    Attends religious service: Not on file    Active member of club or organization: Not on file    Attends meetings of clubs or organizations: Not on file    Relationship status: Not on file  . Intimate partner violence:    Fear of current or ex partner: Not on file    Emotionally abused: Not on file    Physically abused: Not on file    Forced sexual activity: Not on file  Other Topics Concern  . Not on file  Social History Narrative   Household -- pt, wife, 2 children               Allergies as of 11/13/2018   No Known Allergies     Medication List  Accurate as of November 13, 2018  9:20 AM. If you have any questions, ask your nurse or doctor.        amphetamine-dextroamphetamine 10 MG 24 hr capsule Commonly known as:  Adderall XR Take 1 capsule (10 mg total) by mouth daily.   fluticasone 50 MCG/ACT nasal spray Commonly known as:  FLONASE Place 2 sprays into both nostrils daily.   multivitamin tablet Take 1 tablet by mouth daily.           Objective:   Physical Exam There were no vitals taken for this visit. This is a virtual video visit.  Alert oriented x3, no apparent distress    Assessment     Assessment ADD Tic disorder Back pain: 11/2014, so Ortho, local injections, improved   PLAN: ADD: Doing well, work is slow consequently he has not needed Adderall on a regular basis.  He has plenty of medicines at home.  Will call for refill when needed. COVID-19: Following good precautions, physically and emotionally feeling well  Preventive care: Recommend a flu shot earlier this season RTC 05/2019 CPX, will call and schedule    I discussed the assessment and treatment plan with the patient. The patient was provided an opportunity to ask questions and all were answered. The patient agreed with the plan and demonstrated an understanding of the instructions.   The patient was advised to call back or seek an in-person evaluation if the symptoms worsen or if the condition fails to improve as anticipated.

## 2018-12-16 ENCOUNTER — Encounter: Payer: Self-pay | Admitting: Internal Medicine

## 2018-12-17 ENCOUNTER — Telehealth: Payer: Self-pay | Admitting: *Deleted

## 2018-12-17 DIAGNOSIS — Z20822 Contact with and (suspected) exposure to covid-19: Secondary | ICD-10-CM

## 2018-12-17 NOTE — Telephone Encounter (Signed)
-----   Message from Colon Branch, MD sent at 12/17/2018  1:40 PM EDT ----- Regarding: Please arrange call with testing

## 2018-12-17 NOTE — Telephone Encounter (Signed)
Scheduled patient for COVID 19 test 12/20/2018 at 10 am at [facility] per his request.  Testing protocol reviewed.

## 2018-12-20 ENCOUNTER — Other Ambulatory Visit: Payer: 59

## 2018-12-20 ENCOUNTER — Other Ambulatory Visit: Payer: Self-pay

## 2018-12-20 DIAGNOSIS — Z20822 Contact with and (suspected) exposure to covid-19: Secondary | ICD-10-CM

## 2018-12-20 DIAGNOSIS — R6889 Other general symptoms and signs: Secondary | ICD-10-CM | POA: Diagnosis not present

## 2018-12-25 LAB — NOVEL CORONAVIRUS, NAA: SARS-CoV-2, NAA: NOT DETECTED

## 2019-01-21 ENCOUNTER — Telehealth: Payer: Self-pay | Admitting: Internal Medicine

## 2019-01-21 MED ORDER — AMPHETAMINE-DEXTROAMPHET ER 10 MG PO CP24: 10.0000 mg | ORAL_CAPSULE | Freq: Every day | ORAL | 0 refills | Status: DC

## 2019-01-21 MED FILL — ADDERALL XR 10 MG CAP SA: 10 | 30 days supply | Qty: 30 | Fill #0

## 2019-01-21 NOTE — Telephone Encounter (Signed)
Sent x2  

## 2019-01-21 NOTE — Telephone Encounter (Signed)
Adderall rx.   Last OV: 11/13/2018 Last Fill; 09/17/2018 #30 and 0RF UDS: 11/12/2017 Low risk

## 2019-02-18 ENCOUNTER — Ambulatory Visit: Payer: 59

## 2019-03-11 ENCOUNTER — Telehealth: Payer: Self-pay | Admitting: Internal Medicine

## 2019-03-11 MED ORDER — AMPHETAMINE-DEXTROAMPHET ER 10 MG PO CP24: 10.0000 mg | ORAL_CAPSULE | Freq: Every day | ORAL | 0 refills | Status: DC | PRN

## 2019-03-11 MED FILL — ADDERALL XR 10 MG CAP SA: 10 | 30 days supply | Qty: 30 | Fill #0

## 2019-03-11 NOTE — Telephone Encounter (Signed)
Sent!

## 2019-03-11 NOTE — Telephone Encounter (Signed)
Requesting: Adderall XR Contract: 11/12/2017 UDS: 11/12/2017, low risk Last OV: 11/13/2018 Next OV: 05/25/2019 Last Refill: 01/21/2019, #30--0 RF Database:   Please advise

## 2019-03-17 ENCOUNTER — Other Ambulatory Visit: Payer: Self-pay

## 2019-03-17 DIAGNOSIS — Z20828 Contact with and (suspected) exposure to other viral communicable diseases: Secondary | ICD-10-CM | POA: Diagnosis not present

## 2019-03-17 DIAGNOSIS — Z20822 Contact with and (suspected) exposure to covid-19: Secondary | ICD-10-CM

## 2019-03-19 ENCOUNTER — Other Ambulatory Visit: Payer: Self-pay

## 2019-03-19 ENCOUNTER — Ambulatory Visit (INDEPENDENT_AMBULATORY_CARE_PROVIDER_SITE_OTHER): Payer: 59

## 2019-03-19 ENCOUNTER — Encounter: Payer: Self-pay | Admitting: Internal Medicine

## 2019-03-19 DIAGNOSIS — Z23 Encounter for immunization: Secondary | ICD-10-CM | POA: Diagnosis not present

## 2019-03-19 LAB — NOVEL CORONAVIRUS, NAA: SARS-CoV-2, NAA: NOT DETECTED

## 2019-04-24 ENCOUNTER — Telehealth: Payer: Self-pay | Admitting: Internal Medicine

## 2019-04-24 MED ORDER — AMPHETAMINE-DEXTROAMPHET ER 10 MG PO CP24: 10.0000 mg | ORAL_CAPSULE | Freq: Every day | ORAL | 0 refills | Status: DC | PRN

## 2019-04-24 MED FILL — ADDERALL XR 10 MG CAP SA: 10 | 30 days supply | Qty: 30 | Fill #0

## 2019-04-24 NOTE — Telephone Encounter (Signed)
Sent!

## 2019-04-24 NOTE — Telephone Encounter (Signed)
Adderall refill.   Last OV: 11/13/2018, appt scheduled 05/15/2019 Last Fill: 03/11/2019 #30 and 0RF UDS: 11/12/2017 Low risk

## 2019-05-12 ENCOUNTER — Other Ambulatory Visit: Payer: Self-pay

## 2019-05-12 DIAGNOSIS — Z20822 Contact with and (suspected) exposure to covid-19: Secondary | ICD-10-CM

## 2019-05-14 ENCOUNTER — Other Ambulatory Visit: Payer: Self-pay

## 2019-05-14 LAB — NOVEL CORONAVIRUS, NAA: SARS-CoV-2, NAA: NOT DETECTED

## 2019-05-15 ENCOUNTER — Ambulatory Visit (INDEPENDENT_AMBULATORY_CARE_PROVIDER_SITE_OTHER): Payer: 59 | Admitting: Internal Medicine

## 2019-05-15 ENCOUNTER — Other Ambulatory Visit: Payer: Self-pay

## 2019-05-15 ENCOUNTER — Encounter: Payer: Self-pay | Admitting: Internal Medicine

## 2019-05-15 VITALS — BP 141/76 | HR 91 | Temp 97.0°F | Resp 18 | Ht 72.0 in | Wt 267.2 lb

## 2019-05-15 DIAGNOSIS — Z79899 Other long term (current) drug therapy: Secondary | ICD-10-CM

## 2019-05-15 DIAGNOSIS — Z0000 Encounter for general adult medical examination without abnormal findings: Secondary | ICD-10-CM

## 2019-05-15 DIAGNOSIS — F988 Other specified behavioral and emotional disorders with onset usually occurring in childhood and adolescence: Secondary | ICD-10-CM

## 2019-05-15 DIAGNOSIS — Z23 Encounter for immunization: Secondary | ICD-10-CM | POA: Diagnosis not present

## 2019-05-15 LAB — COMPREHENSIVE METABOLIC PANEL
ALT: 47 U/L (ref 0–53)
AST: 27 U/L (ref 0–37)
Albumin: 4.9 g/dL (ref 3.5–5.2)
Alkaline Phosphatase: 69 U/L (ref 39–117)
BUN: 18 mg/dL (ref 6–23)
CO2: 29 mEq/L (ref 19–32)
Calcium: 9.9 mg/dL (ref 8.4–10.5)
Chloride: 103 mEq/L (ref 96–112)
Creatinine, Ser: 0.94 mg/dL (ref 0.40–1.50)
GFR: 88.96 mL/min (ref >60.00)
Glucose, Bld: 92 mg/dL (ref 70–99)
Potassium: 4.8 mEq/L (ref 3.5–5.1)
Sodium: 140 mEq/L (ref 135–145)
Total Bilirubin: 0.9 mg/dL (ref 0.2–1.2)
Total Protein: 7.8 g/dL (ref 6.0–8.3)

## 2019-05-15 LAB — CBC WITH DIFFERENTIAL/PLATELET
Basophils Absolute: 0 10*3/uL (ref 0.0–0.1)
Basophils Relative: 0.8 % (ref 0.0–3.0)
Eosinophils Absolute: 0.1 10*3/uL (ref 0.0–0.7)
Eosinophils Relative: 2.4 % (ref 0.0–5.0)
HCT: 46.5 % (ref 39.0–52.0)
Hemoglobin: 15.8 g/dL (ref 13.0–17.0)
Lymphocytes Relative: 37.2 % (ref 12.0–46.0)
Lymphs Abs: 1.8 10*3/uL (ref 0.7–4.0)
MCHC: 34.1 g/dL (ref 30.0–36.0)
MCV: 91.8 fl (ref 78.0–100.0)
Monocytes Absolute: 0.5 10*3/uL (ref 0.1–1.0)
Monocytes Relative: 10.9 % (ref 3.0–12.0)
Neutro Abs: 2.3 10*3/uL (ref 1.4–7.7)
Neutrophils Relative %: 48.7 % (ref 43.0–77.0)
Platelets: 243 10*3/uL (ref 150.0–400.0)
RBC: 5.07 Mil/uL (ref 4.22–5.81)
RDW: 13.4 % (ref 11.5–15.5)
WBC: 4.8 10*3/uL (ref 4.0–10.5)

## 2019-05-15 LAB — LIPID PANEL
Cholesterol: 232 mg/dL — ABNORMAL HIGH (ref 0–200)
HDL: 59.6 mg/dL (ref >39.00)
LDL Cholesterol: 152 mg/dL — ABNORMAL HIGH (ref 0–99)
NonHDL: 172.57
Total CHOL/HDL Ratio: 4
Triglycerides: 104 mg/dL (ref 0.0–149.0)
VLDL: 20.8 mg/dL (ref 0.0–40.0)

## 2019-05-15 LAB — TSH: TSH: 1.72 u[IU]/mL (ref 0.35–4.50)

## 2019-05-15 NOTE — Assessment & Plan Note (Addendum)
-   Td today.  Had a flu shot -Labs:  CMP, FLP, CBC, TSH, UDS -Diet and exercise: Improving, he remains active at home and at work.  Has changed his diet, has lost some weight.Praised

## 2019-05-15 NOTE — Progress Notes (Signed)
Subjective:    Patient ID: David Buckley, male    DOB: Nov 21, 1980, 38 y.o.   MRN: [id number]  DOS:  05/15/2019 Type of visit - description: CPX No concerns. Good compliance with medications, he remains active, has changed his diet, has lost several pounds.  Wt Readings from Last 3 Encounters:  05/15/19 267 lb 4 oz (121.2 kg)  05/14/18 273 lb 8 oz (124.1 kg)  11/12/17 264 lb 12.8 oz (120.1 kg)     Review of Systems  A 14 point review of systems is negative    Past Medical History:  Diagnosis Date  . ADD (attention deficit disorder)   . Tic disorder    Chronic    Past Surgical History:  Procedure Laterality Date  . HERNIA REPAIR Left ~ 2009  . KNEE ARTHROSCOPY Right 2017  . MRI     L5-S1 problem---recvd nerve root block, denied Sx  . TYMPANOSTOMY TUBE PLACEMENT  2004  . VASECTOMY  03/2018    Social History   Socioeconomic History  . Marital status: Married    Spouse name: Not on file  . Number of children: 2  . Years of education: Not on file  . Highest education level: Not on file  Occupational History  . Occupation: Multimedia programmer estate  appraisal  Tobacco Use  . Smoking status: Never Smoker  . Smokeless tobacco: Never Used  Substance and Sexual Activity  . Alcohol use: Yes    Comment: w-ends only  . Drug use: No  . Sexual activity: Not on file  Other Topics Concern  . Not on file  Social History Narrative   Household -- pt, wife, 2 children            Social Determinants of Health   Financial Resource Strain:   . Difficulty of Paying Living Expenses: Not on file  Food Insecurity:   . Worried About Programme researcher, broadcasting/film/video in the Last Year: Not on file  . Ran Out of Food in the Last Year: Not on file  Transportation Needs:   . Lack of Transportation (Medical): Not on file  . Lack of Transportation (Non-Medical): Not on file  Physical Activity:   . Days of Exercise per Week: Not on file  . Minutes of Exercise per Session: Not on file   Stress:   . Feeling of Stress : Not on file  Social Connections:   . Frequency of Communication with Friends and Family: Not on file  . Frequency of Social Gatherings with Friends and Family: Not on file  . Attends Religious Services: Not on file  . Active Member of Clubs or Organizations: Not on file  . Attends Banker Meetings: Not on file  . Marital Status: Not on file  Intimate Partner Violence:   . Fear of Current or Ex-Partner: Not on file  . Emotionally Abused: Not on file  . Physically Abused: Not on file  . Sexually Abused: Not on file   Family History  Problem Relation Age of Onset  . Diabetes Other        GM  . Prostate cancer Neg Hx   . Colon cancer Neg Hx   . Heart attack Neg Hx      Allergies as of 05/15/2019   No Known Allergies     Medication List       Accurate as of May 15, 2019 11:59 PM. If you have any questions, ask your nurse or doctor.  amphetamine-dextroamphetamine 10 MG 24 hr capsule Commonly known as: Adderall XR Take 1 capsule (10 mg total) by mouth daily as needed.   fluticasone 50 MCG/ACT nasal spray Commonly known as: FLONASE Place 2 sprays into both nostrils daily.   multivitamin tablet Take 1 tablet by mouth daily.           Objective:   Physical Exam BP (!) 141/76 (BP Location: Left Arm)   Pulse 91   Temp (!) 97 F (36.1 C) (Temporal)   Resp 18   Ht 6' (1.829 m)   Wt 267 lb 4 oz (121.2 kg)   SpO2 99%   BMI 36.25 kg/m  General: Well developed, NAD, BMI noted Neck: No  thyromegaly  HEENT:  Normocephalic . Face symmetric, atraumatic Lungs:  CTA B Normal respiratory effort, no intercostal retractions, no accessory muscle use. Heart: RRR,  no murmur.  No pretibial edema bilaterally  Abdomen:  Not distended, soft, non-tender. No rebound or rigidity.   Skin: Exposed areas without rash. Not pale. Not jaundice Neurologic:  alert & oriented X3.  Speech normal, gait appropriate for age and  unassisted Strength symmetric and appropriate for age.  Psych: Cognition and judgment appear intact.  Cooperative with normal attention span and concentration.  Behavior appropriate. No anxious or depressed appearing.     Assessment     Assessment ADD Tic disorder Back pain: 11/2014, saw Ortho, local injections, improved   PLAN: Here for CPX ADD: Well-controlled, no change, UDS Elevated BP: Heart rate is slightly elevated today, on chart review, his heart rate is typically in the high side.  BP recheck: 141/76, heart rate 91 RTC 6 months    This visit occurred during the SARS-CoV-2 public health emergency.  Safety protocols were in place, including screening questions prior to the visit, additional usage of staff PPE, and extensive cleaning of exam room while observing appropriate contact time as indicated for disinfecting solutions.

## 2019-05-15 NOTE — Progress Notes (Signed)
Pre visit review using our clinic review tool, if applicable. No additional management support is needed unless otherwise documented below in the visit note. 

## 2019-05-15 NOTE — Patient Instructions (Signed)
GO TO THE LAB : Get the blood work     GO TO THE FRONT DESK Schedule your next appointment   for routine checkup in 6 months   Check the  blood pressure 2 or 3 times a month  BP GOAL is between 110/65 and  135/85. If it is consistently higher or lower, let me know   

## 2019-05-18 LAB — PAIN MGMT, PROFILE 8 W/CONF, U
6 Acetylmorphine: NEGATIVE ng/mL
Alcohol Metabolites: POSITIVE ng/mL — AB (ref <500)
Amphetamine: 3752 ng/mL
Amphetamines: POSITIVE ng/mL
Benzodiazepines: NEGATIVE ng/mL
Buprenorphine, Urine: NEGATIVE ng/mL
Cocaine Metabolite: NEGATIVE ng/mL
Creatinine: 186.7 mg/dL
Ethyl Glucuronide (ETG): 90793 ng/mL
Ethyl Sulfate (ETS): 17025 ng/mL
MDMA: NEGATIVE ng/mL
Marijuana Metabolite: NEGATIVE ng/mL
Methamphetamine: NEGATIVE ng/mL
Opiates: NEGATIVE ng/mL
Oxidant: NEGATIVE ug/mL
Oxycodone: NEGATIVE ng/mL
pH: 5.9 (ref 4.5–9.0)

## 2019-05-18 NOTE — Assessment & Plan Note (Signed)
Here for CPX ADD: Well-controlled, no change, UDS Elevated BP: Heart rate is slightly elevated today, on chart review, his heart rate is typically in the high side.  BP recheck: 141/76, heart rate 91 RTC 6 months

## 2019-05-27 ENCOUNTER — Ambulatory Visit: Payer: 59 | Attending: Internal Medicine

## 2019-05-27 DIAGNOSIS — Z20822 Contact with and (suspected) exposure to covid-19: Secondary | ICD-10-CM

## 2019-05-27 DIAGNOSIS — Z20828 Contact with and (suspected) exposure to other viral communicable diseases: Secondary | ICD-10-CM | POA: Diagnosis not present

## 2019-05-28 LAB — NOVEL CORONAVIRUS, NAA: SARS-CoV-2, NAA: NOT DETECTED

## 2019-06-24 ENCOUNTER — Ambulatory Visit: Payer: 59 | Attending: Internal Medicine

## 2019-06-24 DIAGNOSIS — Z23 Encounter for immunization: Secondary | ICD-10-CM | POA: Insufficient documentation

## 2019-06-24 NOTE — Progress Notes (Signed)
   Covid-19 Vaccination Clinic  Name:  ZAHIR EISENHOUR    MRN: [id number] DOB: [birth year]  06/24/2019  Mr. Georg was observed post Covid-19 immunization for 15 minutes without incidence. He was provided with Vaccine Information Sheet and instruction to access the V-Safe system.   Mr. Sanker was instructed to call 911 with any severe reactions post vaccine: Marland Kitchen Difficulty breathing  . Swelling of your face and throat  . A fast heartbeat  . A bad rash all over your body  . Dizziness and weakness    Immunizations Administered    Name Date Dose VIS Date Route   Pfizer COVID-19 Vaccine 06/24/2019  7:30 PM 0.3 mL 05/16/2019 Intramuscular   Manufacturer: [employer], [employer]   Lot: [id number]   NDC: [address]-3     Given by Felicita Gage, Rn. Observation by Ardeth Perfect and Felicita Gage.

## 2019-06-25 ENCOUNTER — Telehealth: Payer: Self-pay | Admitting: Internal Medicine

## 2019-06-25 MED ORDER — AMPHETAMINE-DEXTROAMPHET ER 10 MG PO CP24: 10.0000 mg | ORAL_CAPSULE | Freq: Every day | ORAL | 0 refills | Status: DC | PRN

## 2019-06-25 MED FILL — ADDERALL XR 10 MG CAP SA: 10 | 30 days supply | Qty: 30 | Fill #0

## 2019-06-25 NOTE — Telephone Encounter (Signed)
Sent x2  

## 2019-06-25 NOTE — Telephone Encounter (Signed)
Requesting:Adderall Contract:06/22/2015 UDS:05/15/2019 Last Visit:05/15/2019 Next Visit:11/14/2018 Last Refill:04/24/19  Please Advise

## 2019-06-30 ENCOUNTER — Ambulatory Visit: Payer: 59 | Attending: Internal Medicine

## 2019-06-30 DIAGNOSIS — Z20822 Contact with and (suspected) exposure to covid-19: Secondary | ICD-10-CM | POA: Diagnosis not present

## 2019-07-01 LAB — NOVEL CORONAVIRUS, NAA: SARS-CoV-2, NAA: NOT DETECTED

## 2019-07-15 ENCOUNTER — Ambulatory Visit: Payer: 59 | Attending: Internal Medicine

## 2019-07-15 DIAGNOSIS — Z23 Encounter for immunization: Secondary | ICD-10-CM | POA: Insufficient documentation

## 2019-07-15 NOTE — Progress Notes (Signed)
   Covid-19 Vaccination Clinic  Name:  MAYSON MCNEISH    MRN: [id number] DOB: 1981/02/21  07/15/2019  Mr. Minotti was observed post Covid-19 immunization for 15 minutes without incidence. He was provided with Vaccine Information Sheet and instruction to access the V-Safe system.   Mr. Cephas was instructed to call 911 with any severe reactions post vaccine: Marland Kitchen Difficulty breathing  . Swelling of your face and throat  . A fast heartbeat  . A bad rash all over your body  . Dizziness and weakness    Immunizations Administered    Name Date Dose VIS Date Route   Pfizer COVID-19 Vaccine 07/15/2019  8:27 AM 0.3 mL 05/16/2019 Intramuscular   Manufacturer: [employer], [employer]   Lot: [id number]   NDC: [address]-3

## 2019-08-20 ENCOUNTER — Telehealth: Payer: Self-pay | Admitting: Internal Medicine

## 2019-08-20 MED ORDER — AMPHETAMINE-DEXTROAMPHET ER 10 MG PO CP24: 10.0000 mg | ORAL_CAPSULE | Freq: Every day | ORAL | 0 refills | Status: DC | PRN

## 2019-08-20 MED FILL — ADDERALL XR 10 MG CAP SA: 10 | 30 days supply | Qty: 30 | Fill #0

## 2019-08-20 NOTE — Telephone Encounter (Signed)
PDMP okay, prescription sent x 2 

## 2019-08-20 NOTE — Telephone Encounter (Signed)
Adderall refill.   Last OV: 05/15/2019 Last Fill: 06/25/2019 #30 and 0RF Pt sig: 1 tab qd prn UDS: 05/15/2019 Low risk

## 2019-10-06 ENCOUNTER — Telehealth: Payer: Self-pay | Admitting: Internal Medicine

## 2019-10-07 MED ORDER — AMPHETAMINE-DEXTROAMPHET ER 10 MG PO CP24: 10.0000 mg | ORAL_CAPSULE | Freq: Every day | ORAL | 0 refills | Status: DC | PRN

## 2019-10-07 MED FILL — ADDERALL XR 10 MG CAP SA: 10 | 30 days supply | Qty: 30 | Fill #0

## 2019-10-07 NOTE — Telephone Encounter (Signed)
PDMP okay, RF x2

## 2019-10-07 NOTE — Telephone Encounter (Signed)
Adderall refill.   Last OV: 05/15/2019, appt scheduled 11/14/2019 Last Fill: 08/20/2019 #30 and 0RF Pt sig: 1 capsule daily prn UDS: 05/15/2019 Low risk

## 2019-11-14 ENCOUNTER — Encounter: Payer: Self-pay | Admitting: Internal Medicine

## 2019-11-14 ENCOUNTER — Ambulatory Visit: Payer: 59 | Admitting: Internal Medicine

## 2019-11-14 ENCOUNTER — Other Ambulatory Visit: Payer: Self-pay

## 2019-11-14 VITALS — BP 132/78 | HR 106 | Temp 97.8°F | Resp 18 | Ht 72.0 in | Wt 275.2 lb

## 2019-11-14 DIAGNOSIS — R000 Tachycardia, unspecified: Secondary | ICD-10-CM | POA: Diagnosis not present

## 2019-11-14 DIAGNOSIS — R030 Elevated blood-pressure reading, without diagnosis of hypertension: Secondary | ICD-10-CM

## 2019-11-14 DIAGNOSIS — E785 Hyperlipidemia, unspecified: Secondary | ICD-10-CM | POA: Diagnosis not present

## 2019-11-14 DIAGNOSIS — F988 Other specified behavioral and emotional disorders with onset usually occurring in childhood and adolescence: Secondary | ICD-10-CM

## 2019-11-14 NOTE — Progress Notes (Signed)
Subjective:    Patient ID: David Buckley, male    DOB: 06-19-1980, 39 y.o.   MRN: [id number]  DOS:  11/14/2019 Type of visit - description: Routine visit Since the last office visit he is doing well. ADD symptoms controlled For allergies he is taking Zyrtec over-the-counter. I noted his BP and heart rate to be elevated, no recent ambulatory vital signs. He denies headaches, nausea, vomiting. Stress is well manage per patient.  Wt Readings from Last 3 Encounters:  11/14/19 275 lb 4 oz (124.9 kg)  05/15/19 267 lb 4 oz (121.2 kg)  05/14/18 273 lb 8 oz (124.1 kg)     Review of Systems See above   Past Medical History:  Diagnosis Date  . ADD (attention deficit disorder)   . Tic disorder    Chronic    Past Surgical History:  Procedure Laterality Date  . HERNIA REPAIR Left ~ 2009  . KNEE ARTHROSCOPY Right 2017  . MRI     L5-S1 problem---recvd nerve root block, denied Sx  . TYMPANOSTOMY TUBE PLACEMENT  2004  . VASECTOMY  03/2018    Allergies as of 11/14/2019   No Known Allergies     Medication List       Accurate as of November 14, 2019  8:04 AM. If you have any questions, ask your nurse or doctor.        amphetamine-dextroamphetamine 10 MG 24 hr capsule Commonly known as: Adderall XR Take 1 capsule (10 mg total) by mouth daily as needed.   fluticasone 50 MCG/ACT nasal spray Commonly known as: FLONASE Place 2 sprays into both nostrils daily.   multivitamin tablet Take 1 tablet by mouth daily.          Objective:   Physical Exam BP (!) 157/97 (BP Location: Left Arm, Patient Position: Sitting, Cuff Size: Normal)   Pulse (!) 114   Temp 97.8 F (36.6 C) (Temporal)   Resp 18   Ht 6' (1.829 m)   Wt 275 lb 4 oz (124.9 kg)   SpO2 99%   BMI 37.33 kg/m  General:   Well developed, NAD, BMI noted. HEENT:  Normocephalic . Face symmetric, atraumatic Lungs:  CTA B Normal respiratory effort, no intercostal retractions, no accessory muscle use. Heart:  Tachycardic,  no murmur.  Lower extremities: no pretibial edema bilaterally  Skin: Not pale. Not jaundice Neurologic:  alert & oriented X3.  Speech normal, gait appropriate for age and unassisted Psych--  Cognition and judgment appear intact.  Cooperative with normal attention span and concentration.  Behavior appropriate. No anxious or depressed appearing.      Assessment     Assessment ADD Tic disorder Back pain: 11/2014, saw Ortho, local injections, improved   PLAN: Elevated BP, tachycardia: Initial BP elevated, recheck 132/78, heart rate 114 >> recheck:106. No ambulatory BPs but his wrist watch check his heart rate and is always above 100. No history of anemia or thyroid disease.  Does take Adderall and Zyrtec (without the decongestant) EKG today: Sinus tachycardia  Addendum: d/w cardiolgy DOD: Other than sinus tachycardia EKG okay, he is at risk of tachycardia mediated cardiomyopathy, echo is not unreasonable. Plan: Echocardiogram, will let the patient know on ADD: Symptoms controlled, last UDS low risk Elevated cholesterol: Since the last office visit he is more active, diet continue to be ok,  nevertheless weight has come up, will recheck FLP. RTC 6 months   This visit occurred during the SARS-CoV-2 public health emergency.  Safety protocols were  in place, including screening questions prior to the visit, additional usage of staff PPE, and extensive cleaning of exam room while observing appropriate contact time as indicated for disinfecting solutions.

## 2019-11-14 NOTE — Progress Notes (Signed)
Pre visit review using our clinic review tool, if applicable. No additional management support is needed unless otherwise documented below in the visit note. 

## 2019-11-14 NOTE — Patient Instructions (Addendum)
Check the  blood pressure and heart rate twice a month BP GOAL is between 110/65 and  135/85. If it is consistently higher or lower, let me know     GO TO THE FRONT DESK, PLEASE SCHEDULE YOUR APPOINTMENTS Go to [place] at [facility] besetment, walk-in, get your labs next week  Come back for a yearly check up in ~ 6 months

## 2019-11-15 ENCOUNTER — Encounter: Payer: Self-pay | Admitting: Internal Medicine

## 2019-11-15 NOTE — Assessment & Plan Note (Signed)
Elevated BP, tachycardia: Initial BP elevated, recheck 132/78, heart rate 114 >> recheck:106. No ambulatory BPs but his wrist watch check his heart rate and is always above 100. No history of anemia or thyroid disease.  Does take Adderall and Zyrtec (without the decongestant) EKG today: Sinus tachycardia  Addendum: d/w cardiolgy DOD: Other than sinus tachycardia EKG okay, he is at risk of tachycardia mediated cardiomyopathy, echo is not unreasonable. Plan: Echocardiogram, will let the patient know on ADD: Symptoms controlled, last UDS low risk Elevated cholesterol: Since the last office visit he is more active, diet continue to be ok,  nevertheless weight has come up, will recheck FLP. RTC 6 months

## 2019-11-17 ENCOUNTER — Other Ambulatory Visit (INDEPENDENT_AMBULATORY_CARE_PROVIDER_SITE_OTHER): Payer: 59

## 2019-11-17 ENCOUNTER — Other Ambulatory Visit: Payer: Self-pay

## 2019-11-17 DIAGNOSIS — E785 Hyperlipidemia, unspecified: Secondary | ICD-10-CM

## 2019-11-17 LAB — LIPID PANEL
Cholesterol: 204 mg/dL — ABNORMAL HIGH (ref 0–200)
HDL: 56 mg/dL (ref >39.00)
LDL Cholesterol: 131 mg/dL — ABNORMAL HIGH (ref 0–99)
NonHDL: 148.11
Total CHOL/HDL Ratio: 4
Triglycerides: 86 mg/dL (ref 0.0–149.0)
VLDL: 17.2 mg/dL (ref 0.0–40.0)

## 2019-11-17 NOTE — Addendum Note (Signed)
Addended byConrad Creek D on: 11/17/2019 07:41 AM   Modules accepted: Orders

## 2019-11-24 ENCOUNTER — Telehealth: Payer: Self-pay | Admitting: Internal Medicine

## 2019-11-24 MED ORDER — AMPHETAMINE-DEXTROAMPHET ER 10 MG PO CP24: 10.0000 mg | ORAL_CAPSULE | Freq: Every day | ORAL | 0 refills | Status: DC | PRN

## 2019-11-24 MED FILL — ADDERALL XR 10 MG CAP SA: 10 | 30 days supply | Qty: 30 | Fill #0

## 2019-11-24 NOTE — Telephone Encounter (Signed)
Prescription sent x2, PDMP okay 

## 2019-11-24 NOTE — Telephone Encounter (Signed)
Adderall refill.   Last OV: 11/14/2019 Last Fill: 10/07/2019 #30 and 0RF Pt sig: 1 capsule daily prn UDS: 05/25/2019 Low risk

## 2019-12-10 ENCOUNTER — Ambulatory Visit (HOSPITAL_COMMUNITY): Payer: 59 | Attending: Cardiology

## 2019-12-10 ENCOUNTER — Other Ambulatory Visit: Payer: Self-pay

## 2019-12-10 DIAGNOSIS — R000 Tachycardia, unspecified: Secondary | ICD-10-CM | POA: Insufficient documentation

## 2020-01-06 ENCOUNTER — Telehealth: Payer: Self-pay | Admitting: Internal Medicine

## 2020-01-06 MED ORDER — AMPHETAMINE-DEXTROAMPHET ER 10 MG PO CP24: 10.0000 mg | ORAL_CAPSULE | Freq: Every day | ORAL | 0 refills | Status: DC | PRN

## 2020-01-06 MED FILL — ADDERALL XR 10 MG CAP SA: 10 | 30 days supply | Qty: 30 | Fill #0

## 2020-01-06 NOTE — Telephone Encounter (Signed)
PDMP okay, Rx sent x2 

## 2020-01-06 NOTE — Telephone Encounter (Signed)
Requesting:Adderall  Contract: 11/12/2017 needs updated csc UDS:05/15/2019 Last Visit:11/14/2019 Next Visit:05/18/2020 Last Refill:11/24/2019  Please Advise

## 2020-01-08 DIAGNOSIS — Z20828 Contact with and (suspected) exposure to other viral communicable diseases: Secondary | ICD-10-CM | POA: Diagnosis not present

## 2020-02-23 ENCOUNTER — Telehealth: Payer: Self-pay | Admitting: Internal Medicine

## 2020-02-24 MED ORDER — AMPHETAMINE-DEXTROAMPHET ER 10 MG PO CP24: 10.0000 mg | ORAL_CAPSULE | Freq: Every day | ORAL | 0 refills | Status: DC | PRN

## 2020-02-24 MED FILL — ADDERALL XR 10 MG CAP SA: 10 | 30 days supply | Qty: 30 | Fill #0

## 2020-02-24 NOTE — Telephone Encounter (Signed)
Adderall refill.   Last OV: 11/14/19, appt 05/18/2020 Last Fill: 01/06/2020 #30 and 0RF Pt sig: 1 capsule daily prn UDS: 05/15/2019 Low risk

## 2020-02-24 NOTE — Telephone Encounter (Signed)
PDMP okay, Rx sent x2 

## 2020-03-09 ENCOUNTER — Ambulatory Visit: Payer: 59 | Attending: Internal Medicine

## 2020-03-09 DIAGNOSIS — Z23 Encounter for immunization: Secondary | ICD-10-CM

## 2020-03-09 NOTE — Progress Notes (Signed)
   Covid-19 Vaccination Clinic  Name:  David Buckley    MRN: [id number] DOB: 1980/12/14  03/09/2020  Mr. David Buckley was observed post Covid-19 immunization for 15 minutes without incident. He was provided with Vaccine Information Sheet and instruction to access the V-Safe system.   Mr. David Buckley was instructed to call 911 with any severe reactions post vaccine: Marland Kitchen Difficulty breathing  . Swelling of face and throat  . A fast heartbeat  . A bad rash all over body  . Dizziness and weakness

## 2020-04-15 ENCOUNTER — Telehealth: Payer: Self-pay | Admitting: Internal Medicine

## 2020-04-15 ENCOUNTER — Other Ambulatory Visit: Payer: Self-pay | Admitting: Internal Medicine

## 2020-04-15 MED ORDER — AMPHETAMINE-DEXTROAMPHET ER 10 MG PO CP24
10.0000 mg | ORAL_CAPSULE | Freq: Every day | ORAL | 0 refills | Status: DC | PRN
Start: 2020-04-15 — End: 2020-06-01

## 2020-04-15 MED ORDER — AMPHETAMINE-DEXTROAMPHET ER 10 MG PO CP24
10.0000 mg | ORAL_CAPSULE | Freq: Every day | ORAL | 0 refills | Status: DC | PRN
Start: 2020-04-15 — End: 2020-04-15

## 2020-04-15 MED FILL — ADDERALL XR 10 MG CAP SA: 10 | 30 days supply | Qty: 30 | Fill #0

## 2020-04-15 NOTE — Telephone Encounter (Signed)
PDMP okay, 2 prescriptions sent 

## 2020-04-15 NOTE — Telephone Encounter (Signed)
Requesting: Adderall XR 10mg  Contract: 11/12/2017 UDS: 05/15/2019 Last Visit: 11/14/19 Next Visit: 05/18/2020 Last Refill: 02/24/20 #30 and 0RF Pt sig: 1 capsule qd prn  Please Advise

## 2020-04-23 DIAGNOSIS — Z20822 Contact with and (suspected) exposure to covid-19: Secondary | ICD-10-CM | POA: Diagnosis not present

## 2020-05-18 ENCOUNTER — Encounter: Payer: Self-pay | Admitting: Internal Medicine

## 2020-05-18 ENCOUNTER — Other Ambulatory Visit: Payer: Self-pay

## 2020-05-18 ENCOUNTER — Ambulatory Visit (INDEPENDENT_AMBULATORY_CARE_PROVIDER_SITE_OTHER): Payer: 59 | Admitting: Internal Medicine

## 2020-05-18 VITALS — BP 158/98 | HR 98 | Temp 97.7°F | Resp 16 | Ht 72.0 in | Wt 271.5 lb

## 2020-05-18 DIAGNOSIS — Z0000 Encounter for general adult medical examination without abnormal findings: Secondary | ICD-10-CM | POA: Diagnosis not present

## 2020-05-18 DIAGNOSIS — E785 Hyperlipidemia, unspecified: Secondary | ICD-10-CM | POA: Diagnosis not present

## 2020-05-18 DIAGNOSIS — R030 Elevated blood-pressure reading, without diagnosis of hypertension: Secondary | ICD-10-CM | POA: Diagnosis not present

## 2020-05-18 DIAGNOSIS — F988 Other specified behavioral and emotional disorders with onset usually occurring in childhood and adolescence: Secondary | ICD-10-CM

## 2020-05-18 DIAGNOSIS — Z79899 Other long term (current) drug therapy: Secondary | ICD-10-CM

## 2020-05-18 LAB — CBC WITH DIFFERENTIAL/PLATELET
Basophils Absolute: 0 10*3/uL (ref 0.0–0.1)
Basophils Relative: 0.9 % (ref 0.0–3.0)
Eosinophils Absolute: 0.1 10*3/uL (ref 0.0–0.7)
Eosinophils Relative: 2.6 % (ref 0.0–5.0)
HCT: 43.3 % (ref 39.0–52.0)
Hemoglobin: 14.9 g/dL (ref 13.0–17.0)
Lymphocytes Relative: 35.6 % (ref 12.0–46.0)
Lymphs Abs: 1.4 10*3/uL (ref 0.7–4.0)
MCHC: 34.5 g/dL (ref 30.0–36.0)
MCV: 90.7 fl (ref 78.0–100.0)
Monocytes Absolute: 0.4 10*3/uL (ref 0.1–1.0)
Monocytes Relative: 9.2 % (ref 3.0–12.0)
Neutro Abs: 2 10*3/uL (ref 1.4–7.7)
Neutrophils Relative %: 51.7 % (ref 43.0–77.0)
Platelets: 226 10*3/uL (ref 150.0–400.0)
RBC: 4.77 Mil/uL (ref 4.22–5.81)
RDW: 13 % (ref 11.5–15.5)
WBC: 3.9 10*3/uL — ABNORMAL LOW (ref 4.0–10.5)

## 2020-05-18 LAB — COMPREHENSIVE METABOLIC PANEL
ALT: 47 U/L (ref 0–53)
AST: 28 U/L (ref 0–37)
Albumin: 4.5 g/dL (ref 3.5–5.2)
Alkaline Phosphatase: 56 U/L (ref 39–117)
BUN: 17 mg/dL (ref 6–23)
CO2: 29 mEq/L (ref 19–32)
Calcium: 9.5 mg/dL (ref 8.4–10.5)
Chloride: 102 mEq/L (ref 96–112)
Creatinine, Ser: 0.85 mg/dL (ref 0.40–1.50)
GFR: 108.44 mL/min (ref >60.00)
Glucose, Bld: 94 mg/dL (ref 70–99)
Potassium: 4.2 mEq/L (ref 3.5–5.1)
Sodium: 139 mEq/L (ref 135–145)
Total Bilirubin: 0.7 mg/dL (ref 0.2–1.2)
Total Protein: 7.2 g/dL (ref 6.0–8.3)

## 2020-05-18 LAB — LIPID PANEL
Cholesterol: 217 mg/dL — ABNORMAL HIGH (ref 0–200)
HDL: 57.1 mg/dL (ref >39.00)
LDL Cholesterol: 132 mg/dL — ABNORMAL HIGH (ref 0–99)
NonHDL: 159.7
Total CHOL/HDL Ratio: 4
Triglycerides: 139 mg/dL (ref 0.0–149.0)
VLDL: 27.8 mg/dL (ref 0.0–40.0)

## 2020-05-18 LAB — HEMOGLOBIN A1C: Hgb A1c MFr Bld: 5.3 % (ref 4.6–6.5)

## 2020-05-18 LAB — TSH: TSH: 1.86 u[IU]/mL (ref 0.35–4.50)

## 2020-05-18 NOTE — Assessment & Plan Note (Signed)
Here for CPX ADD: Well-controlled, UDS and contract today.  Continue Adderall Elevated BP: BP today upon arrival 157/100, pulse 98.  No recent ambulatory BPs Repeat BP: 158/98 Echo last year normal. Elevated BP and heart rate likely multifactorial including BMI and meds for ADD (noting that he has been on Adderall for many years and previously BPs were okay). Plan: Refer to Dr. Chilton Si to see if evaluation for secondary HTN is warranted. Will have to consider change ADD meds to nonstimulants. Recommend ambulatory BPs. RTC 6 months.

## 2020-05-18 NOTE — Patient Instructions (Signed)
Check the  blood pressure weekly  BP GOAL is between 110/65 and  135/85. If it is consistently higher or lower, let me know   GO TO THE LAB : Get the blood work     GO TO THE FRONT DESK, PLEASE SCHEDULE YOUR APPOINTMENTS Come back for a checkup in 6 months 

## 2020-05-18 NOTE — Assessment & Plan Note (Signed)
-   Td  2020. -COVID vaccines x3 - Had a flu shot -Labs:  CMP, FLP, CBC, TSH, A1c -Diet and exercise: Working long hours, less time to exercise, diet remains healthy.  Weight is a stable.  BMI 36, encourage weight loss, gradually 1 or 2 pounds a month, offered a referral to a dietitian, declined.Marland Kitchen

## 2020-05-18 NOTE — Progress Notes (Signed)
Subjective:    Patient ID: David Buckley, male    DOB: 12-27-1980, 39 y.o.   MRN: [id number]  DOS:  05/18/2020 Type of visit - description: CPX No major concerns.  Wt Readings from Last 3 Encounters:  05/18/20 271 lb 8 oz (123.2 kg)  11/14/19 275 lb 4 oz (124.9 kg)  05/15/19 267 lb 4 oz (121.2 kg)   BP Readings from Last 3 Encounters:  05/18/20 (!) 158/98  11/14/19 132/78  05/15/19 (!) 141/76     Review of Systems  A 14 point review of systems is negative    Past Medical History:  Diagnosis Date  . ADD (attention deficit disorder)   . Tic disorder    Chronic    Past Surgical History:  Procedure Laterality Date  . HERNIA REPAIR Left ~ 2009  . KNEE ARTHROSCOPY Right 2017  . MRI     L5-S1 problem---recvd nerve root block, denied Sx  . TYMPANOSTOMY TUBE PLACEMENT  2004  . VASECTOMY  03/2018    Allergies as of 05/18/2020   No Known Allergies     Medication List       Accurate as of May 18, 2020  5:18 PM. If you have any questions, ask your nurse or doctor.        amphetamine-dextroamphetamine 10 MG 24 hr capsule Commonly known as: Adderall XR Take 1 capsule (10 mg total) by mouth daily as needed.   fluticasone 50 MCG/ACT nasal spray Commonly known as: FLONASE Place 2 sprays into both nostrils daily.   multivitamin tablet Take 1 tablet by mouth daily.          Objective:   Physical Exam BP (!) 158/98 (BP Location: Left Arm, Patient Position: Sitting, Cuff Size: Normal)   Pulse 98   Temp 97.7 F (36.5 C) (Oral)   Resp 16   Ht 6' (1.829 m)   Wt 271 lb 8 oz (123.2 kg)   SpO2 97%   BMI 36.82 kg/m  General: Well developed, NAD, BMI noted Neck: No  thyromegaly  HEENT:  Normocephalic . Face symmetric, atraumatic Lungs:  CTA B Normal respiratory effort, no intercostal retractions, no accessory muscle use. Heart: RRR,  no murmur.  Abdomen:  Not distended, soft, non-tender. No rebound or rigidity.   Lower extremities: no pretibial  edema bilaterally  Skin: Exposed areas without rash. Not pale. Not jaundice Neurologic:  alert & oriented X3.  Speech normal, gait appropriate for age and unassisted Strength symmetric and appropriate for age.  Psych: Cognition and judgment appear intact.  Cooperative with normal attention span and concentration.  Behavior appropriate. No anxious or depressed appearing.     Assessment     Assessment ADD Tic disorder Back pain: 11/2014, saw Ortho, local injections, improved   PLAN: Here for CPX ADD: Well-controlled, UDS and contract today.  Continue Adderall Elevated BP: BP today upon arrival 157/100, pulse 98.  No recent ambulatory BPs Repeat BP: 158/98 Echo last year normal. Elevated BP and heart rate likely multifactorial including BMI and meds for ADD (noting that he has been on Adderall for many years and previously BPs were okay). Plan: Refer to Dr. Chilton Si to see if evaluation for secondary HTN is warranted. Will have to consider change ADD meds to nonstimulants. Recommend ambulatory BPs. RTC 6 months.  This visit occurred during the SARS-CoV-2 public health emergency.  Safety protocols were in place, including screening questions prior to the visit, additional usage of staff PPE, and extensive cleaning of  exam room while observing appropriate contact time as indicated for disinfecting solutions.

## 2020-05-18 NOTE — Progress Notes (Signed)
Pre visit review using our clinic review tool, if applicable. No additional management support is needed unless otherwise documented below in the visit note. 

## 2020-05-20 LAB — DRUG MONITORING, PANEL 8 WITH CONFIRMATION, URINE
6 Acetylmorphine: NEGATIVE ng/mL (ref <10)
Alcohol Metabolites: POSITIVE ng/mL — AB
Amphetamines: NEGATIVE ng/mL (ref <500)
Benzodiazepines: NEGATIVE ng/mL (ref <100)
Buprenorphine, Urine: NEGATIVE ng/mL (ref <5)
Cocaine Metabolite: NEGATIVE ng/mL (ref <150)
Creatinine: 169.5 mg/dL
Ethyl Glucuronide (ETG): 1257 ng/mL — ABNORMAL HIGH (ref <500)
Ethyl Sulfate (ETS): 537 ng/mL — ABNORMAL HIGH (ref <100)
MDMA: NEGATIVE ng/mL (ref <500)
Marijuana Metabolite: NEGATIVE ng/mL (ref <20)
Opiates: NEGATIVE ng/mL (ref <100)
Oxidant: NEGATIVE ug/mL
Oxycodone: NEGATIVE ng/mL (ref <100)
pH: 6.8 (ref 4.5–9.0)

## 2020-05-20 LAB — DM TEMPLATE

## 2020-06-01 ENCOUNTER — Telehealth: Payer: Self-pay | Admitting: Internal Medicine

## 2020-06-01 ENCOUNTER — Other Ambulatory Visit: Payer: Self-pay | Admitting: Internal Medicine

## 2020-06-01 MED ORDER — AMPHETAMINE-DEXTROAMPHET ER 10 MG PO CP24
10.0000 mg | ORAL_CAPSULE | Freq: Every day | ORAL | 0 refills | Status: DC | PRN
Start: 2020-06-01 — End: 2020-06-01

## 2020-06-01 MED ORDER — AMPHETAMINE-DEXTROAMPHET ER 10 MG PO CP24: 10.0000 mg | ORAL_CAPSULE | Freq: Every day | ORAL | 0 refills | Status: DC | PRN

## 2020-06-01 MED FILL — ADDERALL XR 10 MG CAP SA: 10 | 30 days supply | Qty: 30 | Fill #0

## 2020-06-01 NOTE — Telephone Encounter (Signed)
PDMP okay, prescription sent x 2 

## 2020-06-01 NOTE — Telephone Encounter (Signed)
Requesting: Adderall 10 mg Contract: 11/12/2017 UDS: 12/10/220 Last Visit: 05/18/2020 Next Visit: 11/03/2020 Last Refill: 04/15/2020

## 2020-07-22 ENCOUNTER — Telehealth: Payer: Self-pay | Admitting: Internal Medicine

## 2020-07-22 ENCOUNTER — Other Ambulatory Visit: Payer: Self-pay | Admitting: Internal Medicine

## 2020-07-22 MED ORDER — AMPHETAMINE-DEXTROAMPHET ER 10 MG PO CP24: 10.0000 mg | ORAL_CAPSULE | Freq: Every day | ORAL | 0 refills | Status: DC | PRN

## 2020-07-22 MED FILL — ADDERALL XR 10 MG CAP SA: 10 | 30 days supply | Qty: 30 | Fill #0

## 2020-07-22 NOTE — Telephone Encounter (Signed)
PDMP okay, prescriptions sent 

## 2020-07-22 NOTE — Telephone Encounter (Signed)
Requesting: Adderall XR 10mg  Contract: 05/18/2020 UDS: 05/18/2020 Last Visit: 05/18/2020 Next Visit: 11/16/2020 Last Refill: 06/01/2020 #30 and 0RF Pt sig: 1 tab daily prn  Please Advise

## 2020-08-03 ENCOUNTER — Ambulatory Visit (INDEPENDENT_AMBULATORY_CARE_PROVIDER_SITE_OTHER): Payer: 59 | Admitting: Cardiovascular Disease

## 2020-08-03 ENCOUNTER — Encounter: Payer: Self-pay | Admitting: Cardiovascular Disease

## 2020-08-03 ENCOUNTER — Other Ambulatory Visit: Payer: Self-pay

## 2020-08-03 DIAGNOSIS — I158 Other secondary hypertension: Secondary | ICD-10-CM

## 2020-08-03 DIAGNOSIS — E7800 Pure hypercholesterolemia, unspecified: Secondary | ICD-10-CM | POA: Diagnosis not present

## 2020-08-03 DIAGNOSIS — I10 Essential (primary) hypertension: Secondary | ICD-10-CM

## 2020-08-03 HISTORY — DX: Pure hypercholesterolemia, unspecified: E78.00

## 2020-08-03 HISTORY — DX: Essential (primary) hypertension: I10

## 2020-08-03 NOTE — Patient Instructions (Signed)
Medication Instructions:  Your physician recommends that you continue on your current medications as directed. Please refer to the Current Medication list given to you today.    Labwork: NONE   Testing/Procedures: NONE   Follow-Up: AS NEEDED  FOLLOW UP WITH DR PAZ    Special Instructions:   YOU NEED TO EXERCISE 150 MINUTES EACH WEEK  LIMIT YOUR SALT INTAKE TO 1,500 MG DAILY   DASH Eating Plan DASH stands for "Dietary Approaches to Stop Hypertension." The DASH eating plan is a healthy eating plan that has been shown to reduce high blood pressure (hypertension). It may also reduce your risk for type 2 diabetes, heart disease, and stroke. The DASH eating plan may also help with weight loss. What are tips for following this plan?  General guidelines  Avoid eating more than 2,300 mg (milligrams) of salt (sodium) a day. If you have hypertension, you may need to reduce your sodium intake to 1,500 mg a day.  Limit alcohol intake to no more than 1 drink a day for nonpregnant women and 2 drinks a day for men. One drink equals 12 oz of beer, 5 oz of wine, or 1 oz of hard liquor.  Work with your health care provider to maintain a healthy body weight or to lose weight. Ask what an ideal weight is for you.  Get at least 30 minutes of exercise that causes your heart to beat faster (aerobic exercise) most days of the week. Activities may include walking, swimming, or biking.  Work with your health care provider or diet and nutrition specialist (dietitian) to adjust your eating plan to your individual calorie needs. Reading food labels   Check food labels for the amount of sodium per serving. Choose foods with less than 5 percent of the Daily Value of sodium. Generally, foods with less than 300 mg of sodium per serving fit into this eating plan.  To find whole grains, look for the word "whole" as the first word in the ingredient list. Shopping  Buy products labeled as "low-sodium" or "no  salt added."  Buy fresh foods. Avoid canned foods and premade or frozen meals. Cooking  Avoid adding salt when cooking. Use salt-free seasonings or herbs instead of table salt or sea salt. Check with your health care provider or pharmacist before using salt substitutes.  Do not fry foods. Cook foods using healthy methods such as baking, boiling, grilling, and broiling instead.  Cook with heart-healthy oils, such as olive, canola, soybean, or sunflower oil. Meal planning  Eat a balanced diet that includes: ? 5 or more servings of fruits and vegetables each day. At each meal, try to fill half of your plate with fruits and vegetables. ? Up to 6-8 servings of whole grains each day. ? Less than 6 oz of lean meat, poultry, or fish each day. A 3-oz serving of meat is about the same size as a deck of cards. One egg equals 1 oz. ? 2 servings of low-fat dairy each day. ? A serving of nuts, seeds, or beans 5 times each week. ? Heart-healthy fats. Healthy fats called Omega-3 fatty acids are found in foods such as flaxseeds and coldwater fish, like sardines, salmon, and mackerel.  Limit how much you eat of the following: ? Canned or prepackaged foods. ? Food that is high in trans fat, such as fried foods. ? Food that is high in saturated fat, such as fatty meat. ? Sweets, desserts, sugary drinks, and other foods with added sugar. ?  Full-fat dairy products.  Do not salt foods before eating.  Try to eat at least 2 vegetarian meals each week.  Eat more home-cooked food and less restaurant, buffet, and fast food.  When eating at a restaurant, ask that your food be prepared with less salt or no salt, if possible. What foods are recommended? The items listed may not be a complete list. Talk with your dietitian about what dietary choices are best for you. Grains Whole-grain or whole-wheat bread. Whole-grain or whole-wheat pasta. Brown rice. [place]. Bulgur. Whole-grain and low-sodium  cereals. Pita bread. Low-fat, low-sodium crackers. Whole-wheat flour tortillas. Vegetables Fresh or frozen vegetables (raw, steamed, roasted, or grilled). Low-sodium or reduced-sodium tomato and vegetable juice. Low-sodium or reduced-sodium tomato sauce and tomato paste. Low-sodium or reduced-sodium canned vegetables. Fruits All fresh, dried, or frozen fruit. Canned fruit in natural juice (without added sugar). Meat and other protein foods Skinless chicken or [place]. Ground chicken or [place]. Pork with fat trimmed off. Fish and seafood. Egg whites. Dried beans, peas, or lentils. Unsalted nuts, nut butters, and seeds. Unsalted canned beans. Lean cuts of beef with fat trimmed off. Low-sodium, lean deli meat. Dairy Low-fat (1%) or fat-free (skim) milk. Fat-free, low-fat, or reduced-fat cheeses. Nonfat, low-sodium ricotta or cottage cheese. Low-fat or nonfat yogurt. Low-fat, low-sodium cheese. Fats and oils Soft margarine without trans fats. Vegetable oil. Low-fat, reduced-fat, or light mayonnaise and salad dressings (reduced-sodium). Canola, safflower, olive, soybean, and sunflower oils. Avocado. Seasoning and other foods Herbs. Spices. Seasoning mixes without salt. Unsalted popcorn and pretzels. Fat-free sweets. What foods are not recommended? The items listed may not be a complete list. Talk with your dietitian about what dietary choices are best for you. Grains Baked goods made with fat, such as croissants, muffins, or some breads. Dry pasta or rice meal packs. Vegetables Creamed or fried vegetables. Vegetables in a cheese sauce. Regular canned vegetables (not low-sodium or reduced-sodium). Regular canned tomato sauce and paste (not low-sodium or reduced-sodium). Regular tomato and vegetable juice (not low-sodium or reduced-sodium). Angie Fava. Olives. Fruits Canned fruit in a light or heavy syrup. Fried fruit. Fruit in cream or butter sauce. Meat and other protein foods Fatty cuts of meat. Ribs.  Fried meat. Berniece Salines. Sausage. Bologna and other processed lunch meats. Salami. Fatback. Hotdogs. Bratwurst. Salted nuts and seeds. Canned beans with added salt. Canned or smoked fish. Whole eggs or egg yolks. Chicken or [place] with skin. Dairy Whole or 2% milk, cream, and half-and-half. Whole or full-fat cream cheese. Whole-fat or sweetened yogurt. Full-fat cheese. Nondairy creamers. Whipped toppings. Processed cheese and cheese spreads. Fats and oils Butter. Stick margarine. Lard. Shortening. Ghee. Bacon fat. Tropical oils, such as coconut, palm kernel, or palm oil. Seasoning and other foods Salted popcorn and pretzels. Onion salt, garlic salt, seasoned salt, table salt, and sea salt. Worcestershire sauce. Tartar sauce. Barbecue sauce. Teriyaki sauce. Soy sauce, including reduced-sodium. Steak sauce. Canned and packaged gravies. Fish sauce. Oyster sauce. Cocktail sauce. Horseradish that you find on the shelf. Ketchup. Mustard. Meat flavorings and tenderizers. Bouillon cubes. Hot sauce and Tabasco sauce. Premade or packaged marinades. Premade or packaged taco seasonings. Relishes. Regular salad dressings. Where to find more information:  National Heart, Lung, and [place]: https://wilson-eaton.com/  American Heart Association: www.heart.org Summary  The DASH eating plan is a healthy eating plan that has been shown to reduce high blood pressure (hypertension). It may also reduce your risk for type 2 diabetes, heart disease, and stroke.  With the DASH eating plan, you  should limit salt (sodium) intake to 2,300 mg a day. If you have hypertension, you may need to reduce your sodium intake to 1,500 mg a day.  When on the DASH eating plan, aim to eat more fresh fruits and vegetables, whole grains, lean proteins, low-fat dairy, and heart-healthy fats.  Work with your health care provider or diet and nutrition specialist (dietitian) to adjust your eating plan to your individual calorie needs. This  information is not intended to replace advice given to you by your health care provider. Make sure you discuss any questions you have with your health care provider. Document Released: 05/11/2011 Document Revised: 05/04/2017 Document Reviewed: 05/15/2016 Elsevier Patient Education  2020 [employer].

## 2020-08-03 NOTE — Progress Notes (Signed)
Cardiology Office Note   Date:  08/03/2020   ID:  David Buckley, DOB 1980-07-17, MRN [id number]  PCP:  David Plump, MD  Cardiologist:   David Si, MD   No chief complaint on file.   History of Present Illness: David Buckley is a 40 y.o. male who is being seen today for the evaluation of hypertension at the request of David Plump, MD.  David Buckley has been on ADHD medications for over 30 years.  He switch to Adderall in 2004.  His blood pressure has been intermittently elevated and his heart rate is persistently elevated.  He saw Dr. Drue Buckley and it was noted that his BP and heart rate were both elevated.  The rates of each have been increasing over time.  He denies any palpitations, lightheadedness, or dizziness. He was riding the Peloton 5 days per week but has been under a lot of stress with work and family lately.  He has 54 and 60 year old children.  He thinks that this is why he has been gaining weight.  He has no exertional chest pain or shortness of breath.  He denies lower extremity edema, orthopnea, or PND.  He has no hot or cold intolerance.  He sleeps well and denies daytime somnolence.  He does snore at times.  He drinks 2 cups of coffee daily.  He rarely uses NSAIDs or over-the-counter decongestants.  He had an echocardiogram 12/2019 that revealed LVEF 60 to 65% with normal diastolic function.    Screening for Secondary Hypertension:  Causes 08/03/2020 08/03/2020  Drugs/Herbals (No Data) Screened     - Comments 2 cups coffee caffeine, ADHD meds  Renovascular HTN - Not Screened  Sleep Apnea - Screened     - Comments - snoring but no other symptoms  Hyperthyroidism - Screened  Hypothyroidism - Screened  Hyperaldosteronism - Not Screened  Pheochromocytoma - Not Screened  Cushing's Syndrome - Not Screened  Hyperparathyroidism - Not Screened  Coarctation of the Aorta - Screened     - Comments - BP symmetric    Relevant Labs/Studies: Basic Labs Latest Ref Rng & Units  05/18/2020 05/15/2019 05/14/2018  Sodium 135 - 145 mEq/L 139 140 139  Potassium 3.5 - 5.1 mEq/L 4.2 4.8 4.9  Creatinine 0.40 - 1.50 mg/dL 8.31 5.17 6.16    Thyroid  Latest Ref Rng & Units 05/15/2019 05/18/2020  TSH 0.35 - 4.50 uIU/mL 1.72 1.86                   Past Medical History:  Diagnosis Date  . ADD (attention deficit disorder)   . Hypertension 08/03/2020  . Pure hypercholesterolemia 08/03/2020  . Tic disorder    Chronic    Past Surgical History:  Procedure Laterality Date  . HERNIA REPAIR Left ~ 2009  . KNEE ARTHROSCOPY Right 2017  . MRI     L5-S1 problem---recvd nerve root block, denied Sx  . TYMPANOSTOMY TUBE PLACEMENT  2004  . VASECTOMY  03/2018     Current Outpatient Medications  Medication Sig Dispense Refill  . amphetamine-dextroamphetamine (ADDERALL XR) 10 MG 24 hr capsule Take 1 capsule (10 mg total) by mouth daily as needed. 30 capsule 0  . fluticasone (FLONASE) 50 MCG/ACT nasal spray Place 2 sprays into both nostrils daily. 16 g 0  . Multiple Vitamin (MULTIVITAMIN) tablet Take 1 tablet by mouth daily.     No current facility-administered medications for this visit.    Allergies:  Patient has no known allergies.    Social History:  The patient  reports that he has never smoked. He has never used smokeless tobacco. He reports current alcohol use. He reports that he does not use drugs.   Family History:  The patient's family history includes Diabetes in his maternal grandfather and another family member.    ROS:  Please see the history of present illness.   Otherwise, review of systems are positive for none.   All other systems are reviewed and negative.    PHYSICAL EXAM: VS:  BP (!) 144/98   Pulse (!) 102   Ht 6' (1.829 m)   Wt 273 lb 6.4 oz (124 kg)   SpO2 96%   BMI 37.08 kg/m  , BMI Body mass index is 37.08 kg/m. GENERAL:  Well appearing HEENT:  Pupils equal round and reactive, fundi not visualized, oral mucosa unremarkable NECK:  No  jugular venous distention, waveform within normal limits, carotid upstroke brisk and symmetric, no bruits, no thyromegaly LYMPHATICS:  No cervical adenopathy LUNGS:  Clear to auscultation bilaterally HEART:  Tachycardia.  Regular rhythm.  PMI not displaced or sustained,S1 and S2 within normal limits, no S3, no S4, no clicks, no rubs, no murmurs ABD:  Flat, positive bowel sounds normal in frequency in pitch, no bruits, no rebound, no guarding, no midline pulsatile mass, no hepatomegaly, no splenomegaly EXT:  2 plus pulses throughout, no edema, no cyanosis no clubbing SKIN:  No rashes no nodules NEURO:  Cranial nerves II through XII grossly intact, motor grossly intact throughout PSYCH:  Cognitively intact, oriented to person place and time  EKG:  EKG is ordered today. The ekg ordered today demonstrates sinus tachycardia.  Rate 102 bpm.   Recent Labs: 05/18/2020: ALT 47; BUN 17; Creatinine, Ser 0.85; Hemoglobin 14.9; Platelets 226.0; Potassium 4.2; Sodium 139; TSH 1.86    Lipid Panel    Component Value Date/Time   CHOL 217 (H) 05/18/2020 0828   TRIG 139.0 05/18/2020 0828   HDL 57.10 05/18/2020 0828   CHOLHDL 4 05/18/2020 0828   VLDL 27.8 05/18/2020 0828   LDLCALC 132 (H) 05/18/2020 0828      Wt Readings from Last 3 Encounters:  08/03/20 273 lb 6.4 oz (124 kg)  05/18/20 271 lb 8 oz (123.2 kg)  11/14/19 275 lb 4 oz (124.9 kg)      ASSESSMENT AND PLAN:  #Essential hypertension: David Buckley blood pressure has been intermittently elevated for many years.  I suspect that this is attributed to his ADHD medications.  However recently it has become higher and more persistently elevated.  His ASCVD 10-year risk is 1.5%.  Therefore his blood pressure goal is less than 140/90.  I suspect that if he were to increase his exercise again and lose some weight he could get back to the school.  He is not interested in starting any medications.  He will work on increasing to his exercise to at  least 150 minutes weekly and losing weight.  He will limit sodium intake to 1500 mg daily.  He is scheduled to follow-up with Dr. Drue Buckley in a few months.  If his blood pressure remains above goal, would be reasonable to consider adding a beta-blocker for hypertension control.  #Tachycardia: Likely due to ADHD medication.  TSH and CBC are within normal limits.  Echo revealed normal systolic function.  Therefore no indication for adding a beta-blocker given that he is asymptomatic.  Continue to watch for heart failure symptoms.  #  Hyperlipidemia:   Lipids are elevated but ASCVD 10-year risk is 1.5%.  Therefore we will focus on diet and exercise.   Current medicines are reviewed at length with the patient today.  The patient does not have concerns regarding medicines.  The following changes have been made:  no change  Labs/ tests ordered today include:  No orders of the defined types were placed in this encounter.    Disposition:   FU with Rudolph Dobler C. Duke Salvia, MD, [facility] as needed.    Signed, Mak Bonny C. Duke Salvia, MD, [facility]  08/03/2020 12:16 PM    [facility]

## 2020-08-24 ENCOUNTER — Other Ambulatory Visit (HOSPITAL_BASED_OUTPATIENT_CLINIC_OR_DEPARTMENT_OTHER): Payer: Self-pay

## 2020-09-02 ENCOUNTER — Telehealth: Payer: Self-pay | Admitting: Internal Medicine

## 2020-09-02 ENCOUNTER — Other Ambulatory Visit: Payer: Self-pay | Admitting: Internal Medicine

## 2020-09-02 MED ORDER — AMPHETAMINE-DEXTROAMPHET ER 10 MG PO CP24: 10.0000 mg | ORAL_CAPSULE | Freq: Every day | ORAL | 0 refills | Status: DC | PRN

## 2020-09-02 NOTE — Telephone Encounter (Signed)
Requesting: Adderall XR 10mg  Contract: 05/18/2020 UDS: 05/18/2020 Last Visit: 05/18/2020 Next Visit: 11/16/2020 Last Refill: 07/22/2020 #30 and 0RF  Please Advise

## 2020-09-02 NOTE — Telephone Encounter (Signed)
PDMP ok, rx sent  

## 2020-09-07 ENCOUNTER — Other Ambulatory Visit (HOSPITAL_COMMUNITY): Payer: Self-pay

## 2020-09-07 MED FILL — Amphetamine-Dextroamphetamine Cap ER 24HR 10 MG: ORAL | 30 days supply | Qty: 30 | Fill #0 | Status: AC

## 2020-10-20 ENCOUNTER — Other Ambulatory Visit: Payer: Self-pay | Admitting: Internal Medicine

## 2020-10-20 ENCOUNTER — Other Ambulatory Visit (HOSPITAL_COMMUNITY): Payer: Self-pay

## 2020-10-20 MED ORDER — AMPHETAMINE-DEXTROAMPHET ER 10 MG PO CP24
ORAL_CAPSULE | Freq: Every day | ORAL | 0 refills | Status: DC | PRN
  Filled 2020-10-20: qty 30, 30d supply, fill #0

## 2020-10-20 NOTE — Telephone Encounter (Signed)
Paz Pt  Requesting: Contract: 05/18/20 UDS: 05/18/20 Last Visit: 05/18/2020 Next Visit: 11/16/20 Last Refill: 09/02/2020 #30 and 0RF (X2)  Please Advise

## 2020-10-25 ENCOUNTER — Other Ambulatory Visit (HOSPITAL_COMMUNITY): Payer: Self-pay

## 2020-11-03 ENCOUNTER — Other Ambulatory Visit (HOSPITAL_COMMUNITY): Payer: Self-pay

## 2020-11-03 MED ORDER — CARESTART COVID-19 HOME TEST VI KIT
PACK | 0 refills | Status: DC
  Filled 2020-11-03: qty 4, 4d supply, fill #0

## 2020-11-15 ENCOUNTER — Other Ambulatory Visit: Payer: Self-pay

## 2020-11-16 ENCOUNTER — Ambulatory Visit (INDEPENDENT_AMBULATORY_CARE_PROVIDER_SITE_OTHER): Payer: 59 | Admitting: Internal Medicine

## 2020-11-16 ENCOUNTER — Other Ambulatory Visit: Payer: Self-pay

## 2020-11-16 ENCOUNTER — Encounter: Payer: Self-pay | Admitting: Internal Medicine

## 2020-11-16 ENCOUNTER — Other Ambulatory Visit (HOSPITAL_COMMUNITY): Payer: Self-pay

## 2020-11-16 VITALS — BP 160/105 | HR 120 | Temp 98.4°F | Resp 16 | Ht 72.0 in | Wt 277.4 lb

## 2020-11-16 DIAGNOSIS — I10 Essential (primary) hypertension: Secondary | ICD-10-CM

## 2020-11-16 DIAGNOSIS — F988 Other specified behavioral and emotional disorders with onset usually occurring in childhood and adolescence: Secondary | ICD-10-CM | POA: Diagnosis not present

## 2020-11-16 MED ORDER — ATOMOXETINE HCL 40 MG PO CAPS
40.0000 mg | ORAL_CAPSULE | Freq: Every day | ORAL | 0 refills | Status: DC
  Filled 2020-11-16: qty 30, 30d supply, fill #0

## 2020-11-16 NOTE — Assessment & Plan Note (Signed)
Hypertension Saw cardiology 08/03/2020: dx w/ essential HTN, noted to have tachycardia; suspected BP elevation d/t HTN and worsened by ADD meds; tachy d/t meds. BP goal less than 140/90.  They agreed on lifestyle intervention rather than medication for now. At this point, BP continued to be elevated, I rechecked manually: 160/105 L arm. We agreed on the following: D/c adderall Start Strattera 40 mg daily, prescription sent, call in 1 to 2 weeks, let me know how that is working. Monitor BPs, hopefully the change will bring BP down. ADD: See above RTC 4 months

## 2020-11-16 NOTE — Patient Instructions (Signed)
Check the  blood pressure   BP GOAL is between 110/65 and  135/85. If it is consistently higher or lower, let me know  Stop Adderall  Start Strattera 40 mg daily  Call in 1 to 2 weeks and let me know how that is working        GO TO THE FRONT DESK, PLEASE SCHEDULE YOUR APPOINTMENTS Come back for a check up in 4 months     Atomoxetine adverse effects  ?General adverse effects - Common adverse effects of atomoxetine include weight loss, abdominal pain, decreased appetite, vomiting, nausea, dyspepsia, headache, dizziness, somnolence/fatigue, and irritability [2,27,30,80,81]. The risk of adverse effects may be affected by genetic variations in the cytochrome P450 (CYP2D6) enzyme pathway [78].  ?Cardiovascular effects - Atomoxetine has stimulatory effect on the sympathetic nervous system, and rare, but serious, cardiovascular events, including sudden death, may occur during treatment with atomoxetine [73,82]. Children should be evaluated for cardiac disease before initiation of pharmacotherapy for ADHD. This evaluation is discussed separately. (See "Cardiac evaluation of patients receiving pharmacotherapy for attention deficit hyperactivity disorder", section on 'Cardiac evaluation'.)  ?Priapism - Priapism is a rare, but serious, adverse effect of atomoxetine [73]. (See "Priapism".)  ?Suicidal thinking - Atomoxetine has a boxed warning and additional warning statements regarding the increased risk of suicidal thinking in children and adolescents treated with atomoxetine.

## 2020-11-16 NOTE — Progress Notes (Signed)
Subjective:    Patient ID: David Buckley, male    DOB: 02/14/1981, 40 y.o.   MRN: [id number]  DOS:  11/16/2020 Type of visit - description: F/U  Since the last office visit he is doing well. No recent ambulatory BPs Denies chest pain, difficulty breathing, palpitations. Stress is at baseline He is consciously trying to eat less salt trying to be active daily.  Review of Systems See above   Past Medical History:  Diagnosis Date   ADD (attention deficit disorder)    Hypertension 08/03/2020   Pure hypercholesterolemia 08/03/2020   Tic disorder    Chronic    Past Surgical History:  Procedure Laterality Date   HERNIA REPAIR Left ~ 2009   KNEE ARTHROSCOPY Right 2017   MRI     L5-S1 problem---recvd nerve root block, denied Sx   TYMPANOSTOMY TUBE PLACEMENT  2004   VASECTOMY  03/2018    Allergies as of 11/16/2020   No Known Allergies      Medication List        Accurate as of November 16, 2020  6:03 PM. If you have any questions, ask your nurse or doctor.          STOP taking these medications    Adderall XR 10 MG 24 hr capsule Generic drug: amphetamine-dextroamphetamine Stopped by: Willow Ora, MD   amphetamine-dextroamphetamine 10 MG 24 hr capsule Commonly known as: ADDERALL XR Stopped by: Willow Ora, MD       TAKE these medications    atomoxetine 40 MG capsule Commonly known as: Strattera Take 1 capsule (40 mg total) by mouth daily. Started by: Willow Ora, MD   fluticasone 50 MCG/ACT nasal spray Commonly known as: FLONASE Place 2 sprays into both nostrils daily.   multivitamin tablet Take 1 tablet by mouth daily.           Objective:   Physical Exam BP (!) 160/105 (BP Location: Left Arm, Patient Position: Sitting, Cuff Size: Normal)   Pulse (!) 120   Temp 98.4 F (36.9 C) (Oral)   Resp 16   Ht 6' (1.829 m)   Wt 277 lb 6 oz (125.8 kg)   SpO2 97%   BMI 37.62 kg/m  General:   Well developed, NAD, BMI noted. HEENT:  Normocephalic . Face  symmetric, atraumatic Lungs:  CTA B Normal respiratory effort, no intercostal retractions, no accessory muscle use. Heart: Tachycardic.  Lower extremities: no pretibial edema bilaterally  Skin: Not pale. Not jaundice Neurologic:  alert & oriented X3.  Speech normal, gait appropriate for age and unassisted Psych--  Cognition and judgment appear intact.  Cooperative with normal attention span and concentration.  Behavior appropriate. No anxious or depressed appearing.      Assessment     Assessment ADD Tic disorder (d/t Cylert?) Back pain: 11/2014, saw Ortho, local injections, improved   PLAN: Hypertension Saw cardiology 08/03/2020: dx w/ essential HTN, noted to have tachycardia; suspected BP elevation d/t HTN and worsened by ADD meds; tachy d/t meds. BP goal less than 140/90.  They agreed on lifestyle intervention rather than medication for now. At this point, BP continued to be elevated, I rechecked manually: 160/105 L arm. We agreed on the following: D/c adderall Start Strattera 40 mg daily, prescription sent, call in 1 to 2 weeks, let me know how that is working. Monitor BPs, hopefully the change will bring BP down. ADD: See above RTC 4 months    Time spent 30 minutes, discussing options including adding  medication for blood pressure or change therapy for ADD.  Eventually we agreed on a trial with Strattera.  This visit occurred during the SARS-CoV-2 public health emergency.  Safety protocols were in place, including screening questions prior to the visit, additional usage of staff PPE, and extensive cleaning of exam room while observing appropriate contact time as indicated for disinfecting solutions.

## 2020-11-18 ENCOUNTER — Other Ambulatory Visit (HOSPITAL_COMMUNITY): Payer: Self-pay

## 2020-12-14 ENCOUNTER — Encounter: Payer: Self-pay | Admitting: Internal Medicine

## 2020-12-22 ENCOUNTER — Telehealth: Payer: 59 | Admitting: Family

## 2020-12-22 ENCOUNTER — Other Ambulatory Visit (HOSPITAL_COMMUNITY): Payer: Self-pay

## 2020-12-22 ENCOUNTER — Encounter: Payer: Self-pay | Admitting: Internal Medicine

## 2020-12-22 ENCOUNTER — Encounter: Payer: Self-pay | Admitting: Family

## 2020-12-22 DIAGNOSIS — U071 COVID-19: Secondary | ICD-10-CM

## 2020-12-22 DIAGNOSIS — E669 Obesity, unspecified: Secondary | ICD-10-CM

## 2020-12-22 MED ORDER — NIRMATRELVIR/RITONAVIR (PAXLOVID)TABLET
3.0000 | ORAL_TABLET | Freq: Two times a day (BID) | ORAL | 0 refills | Status: AC
  Filled 2020-12-22: qty 30, 5d supply, fill #0

## 2020-12-22 NOTE — Progress Notes (Signed)
Virtual Visit Consent   [facility], you are scheduled for a virtual visit with a [facility] provider today.     Just as with appointments in the office, your consent must be obtained to participate.  Your consent will be active for this visit and any virtual visit you may have with one of our providers in the next 365 days.     If you have a MyChart account, a copy of this consent can be sent to you electronically.  All virtual visits are billed to your insurance company just like a traditional visit in the office.    As this is a virtual visit, video technology does not allow for your provider to perform a traditional examination.  This may limit your provider's ability to fully assess your condition.  If your provider identifies any concerns that need to be evaluated in person or the need to arrange testing (such as labs, EKG, etc.), we will make arrangements to do so.     Although advances in technology are sophisticated, we cannot ensure that it will always work on either your end or our end.  If the connection with a video visit is poor, the visit may have to be switched to a telephone visit.  With either a video or telephone visit, we are not always able to ensure that we have a secure connection.     I need to obtain your verbal consent now.   Are you willing to proceed with your visit today?    David Buckley has provided verbal consent on 12/22/2020 for a virtual visit (video or telephone).   David Rodney, FNP   Date: 12/22/2020 10:06 AM   Virtual Visit via Video Note   I, David Buckley, connected with  David [facility]ley  ([id number], September 26, 1980) on 12/22/20 at 10:00 AM EDT by a video-enabled telemedicine application and verified that I am speaking with the correct person using two identifiers.  Location: Patient: Virtual Visit Location Patient: Home Provider: Virtual Visit Location Provider: Home   I discussed the limitations of evaluation and management by telemedicine  and the availability of in person appointments. The patient expressed understanding and agreed to proceed.    History of Present Illness: David [facility]ley is a 40 y.o. who identifies as a male who was assigned male at birth, and is being seen today for COVID. His symptoms started on 12/20/20 and tested positive on 12/21/20.  HPI: Cough Associated symptoms include headaches, myalgias, nasal congestion, postnasal drip and shortness of breath (at night). Pertinent negatives include no ear congestion, ear pain, fever or wheezing. He has tried rest and OTC cough suppressant for the symptoms. The treatment provided mild relief.   Problems:  Patient Active Problem List   Diagnosis Date Noted   Hypertension 08/03/2020   Pure hypercholesterolemia 08/03/2020   PCP NOTES >>>>>>>>>>>>>>>.. 01/20/2016   Annual physical exam 06/03/2014   Obesity 01/24/2011   TIC DISORDER, UNSPECIFIED 04/08/2007   Attention deficit disorder 11/08/2006    Allergies: No Known Allergies Medications:  Current Outpatient Medications:    nirmatrelvir/ritonavir EUA (PAXLOVID) TABS, Take 3 tablets by mouth 2 (two) times daily for 5 days. (Take nirmatrelvir 150 mg two tablets twice daily for 5 days and ritonavir 100 mg one tablet twice daily for 5 days), Disp: 30 tablet, Rfl: 0   atomoxetine (STRATTERA) 40 MG capsule, Take 1 capsule (40 mg total) by mouth daily., Disp: 30 capsule, Rfl: 0   fluticasone (FLONASE) 50 MCG/ACT nasal  spray, Place 2 sprays into both nostrils daily., Disp: 16 g, Rfl: 0   Multiple Vitamin (MULTIVITAMIN) tablet, Take 1 tablet by mouth daily., Disp: , Rfl:   Observations/Objective: Patient is well-developed, well-nourished in no acute distress.  Resting comfortably at home.  Head is normocephalic, atraumatic.  No labored breathing. Speech is clear and coherent with logical content.  Patient is alert and oriented at baseline.  Nasal congestion  Assessment and Plan: 1. COVID-19 virus detected -  nirmatrelvir/ritonavir EUA (PAXLOVID) TABS; Take 3 tablets by mouth 2 (two) times daily for 5 days. (Take nirmatrelvir 150 mg two tablets twice daily for 5 days and ritonavir 100 mg one tablet twice daily for 5 days)  Dispense: 30 tablet; Refill: 0  2. Obesity, unspecified classification, unspecified obesity type, unspecified whether serious comorbidity present - Take meds as prescribed - Use a cool mist humidifier  -Use saline nose sprays frequently -Force fluids -For any cough or congestion  Use plain Mucinex- regular strength or max strength is fine -For fever or aces or pains- take tylenol or ibuprofen. -Throat lozenges if help -Adverse effects discussed    Follow Up Instructions: I discussed the assessment and treatment plan with the patient. The patient was provided an opportunity to ask questions and all were answered. The patient agreed with the plan and demonstrated an understanding of the instructions.  A copy of instructions were sent to the patient via MyChart.  The patient was advised to call back or seek an in-person evaluation if the symptoms worsen or if the condition fails to improve as anticipated.  Time:  I spent 16 minutes with the patient via telehealth technology discussing the above problems/concerns.    David Rodney, FNP

## 2020-12-23 ENCOUNTER — Telehealth: Payer: 59 | Admitting: Internal Medicine

## 2020-12-28 ENCOUNTER — Encounter: Payer: Self-pay | Admitting: Internal Medicine

## 2020-12-29 ENCOUNTER — Other Ambulatory Visit (HOSPITAL_COMMUNITY): Payer: Self-pay

## 2020-12-29 ENCOUNTER — Other Ambulatory Visit: Payer: Self-pay | Admitting: Internal Medicine

## 2020-12-29 MED ORDER — AMPHETAMINE-DEXTROAMPHET ER 10 MG PO CP24
10.0000 mg | ORAL_CAPSULE | Freq: Every day | ORAL | 0 refills | Status: DC | PRN
  Filled 2020-12-29: qty 30, 30d supply, fill #0

## 2020-12-29 NOTE — Telephone Encounter (Signed)
Confirmed w/ [facility] that Pt received Strattera 40mg  on 11/18/2020.

## 2021-02-02 ENCOUNTER — Encounter: Payer: Self-pay | Admitting: Internal Medicine

## 2021-02-02 DIAGNOSIS — E669 Obesity, unspecified: Secondary | ICD-10-CM

## 2021-02-15 ENCOUNTER — Other Ambulatory Visit (HOSPITAL_COMMUNITY): Payer: Self-pay

## 2021-02-15 ENCOUNTER — Telehealth: Payer: Self-pay | Admitting: Internal Medicine

## 2021-02-15 MED ORDER — AMPHETAMINE-DEXTROAMPHET ER 10 MG PO CP24
10.0000 mg | ORAL_CAPSULE | Freq: Every day | ORAL | 0 refills | Status: DC | PRN
  Filled 2021-02-15: qty 30, 30d supply, fill #0

## 2021-02-15 NOTE — Telephone Encounter (Signed)
PDMP okay, Rx sent 

## 2021-02-15 NOTE — Telephone Encounter (Signed)
Requesting: adderall xr Contract: 05/18/20 UDS: 05/18/20 Last Visit: 11/16/20 Next Visit: 03/21/21 Last Refill: 12/29/20  Please Advise

## 2021-03-21 ENCOUNTER — Ambulatory Visit: Payer: 59 | Admitting: Internal Medicine

## 2021-04-11 ENCOUNTER — Telehealth: Payer: Self-pay | Admitting: Internal Medicine

## 2021-04-12 ENCOUNTER — Other Ambulatory Visit (HOSPITAL_COMMUNITY): Payer: Self-pay

## 2021-04-12 MED ORDER — AMPHETAMINE-DEXTROAMPHET ER 10 MG PO CP24
10.0000 mg | ORAL_CAPSULE | Freq: Every day | ORAL | 0 refills | Status: DC | PRN
  Filled 2021-04-12: qty 30, 30d supply, fill #0

## 2021-04-12 NOTE — Telephone Encounter (Signed)
Requesting: Adderall XR 10mg  Contract: 05/18/20 UDS: 05/18/20 Last Visit: 11/16/20 Next Visit: 05/02/21 Last Refill: 02/15/21  Please Advise

## 2021-04-12 NOTE — Telephone Encounter (Signed)
PDMP okay, Rx sent 

## 2021-05-02 ENCOUNTER — Ambulatory Visit: Payer: 59 | Admitting: Internal Medicine

## 2021-05-02 ENCOUNTER — Other Ambulatory Visit: Payer: Self-pay

## 2021-05-02 ENCOUNTER — Ambulatory Visit (INDEPENDENT_AMBULATORY_CARE_PROVIDER_SITE_OTHER): Payer: 59 | Admitting: Internal Medicine

## 2021-05-02 ENCOUNTER — Encounter: Payer: Self-pay | Admitting: Internal Medicine

## 2021-05-02 VITALS — BP 146/92 | HR 122 | Temp 98.4°F | Resp 16 | Ht 72.0 in | Wt 286.5 lb

## 2021-05-02 DIAGNOSIS — R000 Tachycardia, unspecified: Secondary | ICD-10-CM

## 2021-05-02 DIAGNOSIS — Z79899 Other long term (current) drug therapy: Secondary | ICD-10-CM | POA: Diagnosis not present

## 2021-05-02 DIAGNOSIS — Z23 Encounter for immunization: Secondary | ICD-10-CM | POA: Diagnosis not present

## 2021-05-02 DIAGNOSIS — R030 Elevated blood-pressure reading, without diagnosis of hypertension: Secondary | ICD-10-CM | POA: Diagnosis not present

## 2021-05-02 DIAGNOSIS — I10 Essential (primary) hypertension: Secondary | ICD-10-CM

## 2021-05-02 DIAGNOSIS — F988 Other specified behavioral and emotional disorders with onset usually occurring in childhood and adolescence: Secondary | ICD-10-CM | POA: Diagnosis not present

## 2021-05-02 NOTE — Progress Notes (Signed)
Subjective:    Patient ID: David [facility], male    DOB: [birth year], 40 y.o.   MRN: [id number]  DOS:  05/02/2021 Type of visit - description: f/u  Today with talk about ADD, tachycardia, weight loss. In general feels well.  He is trying to be more active and is exercising almost daily. Denies chest pain, difficulty breathing or palpitations. No recent ambulatory BPs.   Wt Readings from Last 3 Encounters:  05/02/21 286 lb 8 oz (130 kg)  11/16/20 277 lb 6 oz (125.8 kg)  08/03/20 273 lb 6.4 oz (124 kg)     Review of Systems See above   Past Medical History:  Diagnosis Date   ADD (attention deficit disorder)    Hypertension 08/03/2020   Pure hypercholesterolemia 08/03/2020   Tic disorder    Chronic    Past Surgical History:  Procedure Laterality Date   HERNIA REPAIR Left ~ 2009   KNEE ARTHROSCOPY Right 2017   MRI     L5-S1 problem---recvd nerve root block, denied Sx   TYMPANOSTOMY TUBE PLACEMENT  2004   VASECTOMY  03/2018    Allergies as of 05/02/2021   No Known Allergies      Medication List        Accurate as of May 02, 2021 11:59 PM. If you have any questions, ask your nurse or doctor.          STOP taking these medications    fluticasone 50 MCG/ACT nasal spray Commonly known as: FLONASE Stopped by: Willow Ora, MD   multivitamin tablet Stopped by: Willow Ora, MD       TAKE these medications    Adderall XR 10 MG 24 hr capsule Generic drug: amphetamine-dextroamphetamine Take 1 capsule  by mouth daily as needed.           Objective:   Physical Exam BP (!) 146/92 (BP Location: Left Arm, Patient Position: Sitting, Cuff Size: Normal)   Pulse (!) 122   Temp 98.4 F (36.9 C) (Oral)   Resp 16   Ht 6' (1.829 m)   Wt 286 lb 8 oz (130 kg)   SpO2 97%   BMI 38.86 kg/m  General:   Well developed, NAD, BMI noted. HEENT:  Normocephalic . Face symmetric, atraumatic Lungs:  CTA B Normal respiratory effort, no intercostal retractions, no  accessory muscle use. Heart: Tachycardic, no murmur.  Lower extremities: no pretibial edema bilaterally  Skin: Not pale. Not jaundice Neurologic:  alert & oriented X3.  Speech normal, gait appropriate for age and unassisted Psych--  Cognition and judgment appear intact.  Cooperative with normal attention span and concentration.  Behavior appropriate. No anxious or depressed appearing.      Assessment    Assessment ADD Tic disorder (d/t Cylert?) Back pain: 11/2014, saw Ortho, local injections, improved Tachycardia: saw cards, d/t ADD meds?  Echo 12-2019: Normal.  PLAN: Tachycardia: At the last visit, following the recommendations from cardiology we switch from Adderall to Strattera to see if that helped tachycardia however he had to go back to Adderall as the Strattera was not effective. We do not know if his BP or tachycardia improve while he was off Adderall. His BP also continue to be elevated. I talk about start  a medication (beta-blocker) for BP management but he prefers to continue exercising regularly and will get more serious about his diet ([facility]). Recheck CBC and TFTs Elevated BP: See above ADD: See above, back on  Adderall, check UDS Preventive care flu shot, encourage COVID-vaccine. RTC 4 months     This visit occurred during the SARS-CoV-2 public health emergency.  Safety protocols were in place, including screening questions prior to the visit, additional usage of staff PPE, and extensive cleaning of exam room while observing appropriate contact time as indicated for disinfecting solutions.

## 2021-05-02 NOTE — Patient Instructions (Signed)
Check the  blood pressure and your heart rate weekly  Heart rate should be less than 100 BP GOAL is between 110/65 and  135/85. If it is consistently higher or lower, let me know  Arrange a visit to the wellness center Until then, focus on reduce your carbohydrate intake.   GO TO THE LAB : Get the blood work     GO TO THE FRONT DESK, PLEASE SCHEDULE YOUR APPOINTMENTS Come back for a checkup in 4 months

## 2021-05-03 LAB — CBC WITH DIFFERENTIAL/PLATELET
Basophils Absolute: 0 10*3/uL (ref 0.0–0.1)
Basophils Relative: 0.8 % (ref 0.0–3.0)
Eosinophils Absolute: 0.1 10*3/uL (ref 0.0–0.7)
Eosinophils Relative: 1.3 % (ref 0.0–5.0)
HCT: 45.6 % (ref 39.0–52.0)
Hemoglobin: 15.7 g/dL (ref 13.0–17.0)
Lymphocytes Relative: 27.6 % (ref 12.0–46.0)
Lymphs Abs: 1.7 10*3/uL (ref 0.7–4.0)
MCHC: 34.5 g/dL (ref 30.0–36.0)
MCV: 92.3 fl (ref 78.0–100.0)
Monocytes Absolute: 0.6 10*3/uL (ref 0.1–1.0)
Monocytes Relative: 9.2 % (ref 3.0–12.0)
Neutro Abs: 3.8 10*3/uL (ref 1.4–7.7)
Neutrophils Relative %: 61.1 % (ref 43.0–77.0)
Platelets: 254 10*3/uL (ref 150.0–400.0)
RBC: 4.94 Mil/uL (ref 4.22–5.81)
RDW: 13.1 % (ref 11.5–15.5)
WBC: 6.2 10*3/uL (ref 4.0–10.5)

## 2021-05-03 LAB — T3, FREE: T3, Free: 3.6 pg/mL (ref 2.3–4.2)

## 2021-05-03 LAB — TSH: TSH: 2.61 u[IU]/mL (ref 0.35–5.50)

## 2021-05-03 LAB — T4, FREE: Free T4: 0.72 ng/dL (ref 0.60–1.60)

## 2021-05-03 NOTE — Assessment & Plan Note (Signed)
Assessment ADD Tic disorder (d/t Cylert?) Back pain: 11/2014, saw Ortho, local injections, improved Tachycardia: saw cards, d/t ADD meds?  Echo 12-2019: Normal.  PLAN: Tachycardia: At the last visit, following the recommendations from cardiology we switch from Adderall to Strattera to see if that helped tachycardia however he had to go back to Adderall as the Strattera was not effective. We do not know if his BP or tachycardia improve while he was off Adderall. His BP also continue to be elevated. I talk about start  a medication (beta-blocker) for BP management but he prefers to continue exercising regularly and will get more serious about his diet ([facility] provided). Recheck CBC and TFTs Elevated BP: See above ADD: See above, back on Adderall, check UDS Preventive care flu shot, encourage COVID-vaccine. RTC 4 months

## 2021-05-06 LAB — DRUG MONITORING PANEL 375977 , URINE
Alcohol Metabolites: POSITIVE ng/mL — AB (ref <500)
Amphetamine: 3620 ng/mL — ABNORMAL HIGH (ref <250)
Amphetamines: POSITIVE ng/mL — AB (ref <500)
Barbiturates: NEGATIVE ng/mL (ref <300)
Benzodiazepines: NEGATIVE ng/mL (ref <100)
Cocaine Metabolite: NEGATIVE ng/mL (ref <150)
Desmethyltramadol: NEGATIVE ng/mL (ref <100)
Ethyl Glucuronide (ETG): >10000 ng/mL — ABNORMAL HIGH (ref <500)
Ethyl Sulfate (ETS): 4274 ng/mL — ABNORMAL HIGH (ref <100)
Marijuana Metabolite: NEGATIVE ng/mL (ref <20)
Methamphetamine: NEGATIVE ng/mL (ref <250)
Opiates: NEGATIVE ng/mL (ref <100)
Oxycodone: NEGATIVE ng/mL (ref <100)
Tramadol: NEGATIVE ng/mL (ref <100)

## 2021-05-06 LAB — DM TEMPLATE

## 2021-05-18 ENCOUNTER — Other Ambulatory Visit (HOSPITAL_COMMUNITY): Payer: Self-pay

## 2021-05-18 MED ORDER — CARESTART COVID-19 HOME TEST VI KIT
PACK | 0 refills | Status: DC
  Filled 2021-05-18: qty 4, 4d supply, fill #0

## 2021-06-08 ENCOUNTER — Other Ambulatory Visit (HOSPITAL_COMMUNITY): Payer: Self-pay

## 2021-06-08 ENCOUNTER — Telehealth: Payer: Self-pay | Admitting: Internal Medicine

## 2021-06-08 MED ORDER — AMPHETAMINE-DEXTROAMPHET ER 10 MG PO CP24
10.0000 mg | ORAL_CAPSULE | Freq: Every day | ORAL | 0 refills | Status: DC | PRN
  Filled 2021-06-08 – 2021-09-08 (×2): qty 30, 30d supply, fill #0

## 2021-06-08 MED ORDER — AMPHETAMINE-DEXTROAMPHET ER 10 MG PO CP24
10.0000 mg | ORAL_CAPSULE | Freq: Every day | ORAL | 0 refills | Status: DC | PRN
  Filled 2021-06-08: qty 30, 30d supply, fill #0

## 2021-06-08 NOTE — Telephone Encounter (Signed)
PDMP okay, Rx sent 

## 2021-06-08 NOTE — Telephone Encounter (Signed)
Requesting: Adderall XR 10mg   Contract: 05/18/2020 UDS: 05/02/2021 Last Visit: 05/02/2021 Next Visit: 08/29/2021 Last Refill: 04/12/2021 #30 and 0RF  Please Advise

## 2021-07-25 ENCOUNTER — Other Ambulatory Visit (HOSPITAL_COMMUNITY): Payer: Self-pay

## 2021-07-25 ENCOUNTER — Telehealth: Payer: Self-pay | Admitting: Internal Medicine

## 2021-07-25 MED ORDER — AMPHETAMINE-DEXTROAMPHET ER 10 MG PO CP24
10.0000 mg | ORAL_CAPSULE | Freq: Every day | ORAL | 0 refills | Status: DC | PRN
  Filled 2021-07-25: qty 30, 30d supply, fill #0

## 2021-07-25 NOTE — Telephone Encounter (Signed)
Requesting: Adderall XR 10mg   Contract: 05/18/2020 UDS: 05/02/2021 Last Visit: 05/02/2021 Next Visit: 08/29/2021 Last Refill: 06/08/2021 #30 and 0RF (x2)  Please Advise

## 2021-07-25 NOTE — Telephone Encounter (Signed)
PDMP review, Rx sent 

## 2021-08-29 ENCOUNTER — Ambulatory Visit: Payer: 59 | Admitting: Internal Medicine

## 2021-08-29 ENCOUNTER — Other Ambulatory Visit (HOSPITAL_BASED_OUTPATIENT_CLINIC_OR_DEPARTMENT_OTHER): Payer: Self-pay

## 2021-08-29 ENCOUNTER — Encounter: Payer: Self-pay | Admitting: Internal Medicine

## 2021-08-29 VITALS — BP 138/90 | HR 107 | Temp 98.3°F | Resp 16 | Ht 72.0 in | Wt 290.0 lb

## 2021-08-29 DIAGNOSIS — Z0000 Encounter for general adult medical examination without abnormal findings: Secondary | ICD-10-CM | POA: Diagnosis not present

## 2021-08-29 DIAGNOSIS — F988 Other specified behavioral and emotional disorders with onset usually occurring in childhood and adolescence: Secondary | ICD-10-CM

## 2021-08-29 DIAGNOSIS — I10 Essential (primary) hypertension: Secondary | ICD-10-CM | POA: Diagnosis not present

## 2021-08-29 DIAGNOSIS — E7800 Pure hypercholesterolemia, unspecified: Secondary | ICD-10-CM

## 2021-08-29 DIAGNOSIS — R000 Tachycardia, unspecified: Secondary | ICD-10-CM | POA: Diagnosis not present

## 2021-08-29 DIAGNOSIS — Z1159 Encounter for screening for other viral diseases: Secondary | ICD-10-CM

## 2021-08-29 DIAGNOSIS — Z1211 Encounter for screening for malignant neoplasm of colon: Secondary | ICD-10-CM

## 2021-08-29 LAB — LIPID PANEL
Cholesterol: 202 mg/dL — ABNORMAL HIGH (ref 0–200)
HDL: 55.9 mg/dL (ref >39.00)
LDL Cholesterol: 129 mg/dL — ABNORMAL HIGH (ref 0–99)
NonHDL: 146.27
Total CHOL/HDL Ratio: 4
Triglycerides: 88 mg/dL (ref 0.0–149.0)
VLDL: 17.6 mg/dL (ref 0.0–40.0)

## 2021-08-29 LAB — COMPREHENSIVE METABOLIC PANEL
ALT: 48 U/L (ref 0–53)
AST: 26 U/L (ref 0–37)
Albumin: 4.7 g/dL (ref 3.5–5.2)
Alkaline Phosphatase: 57 U/L (ref 39–117)
BUN: 16 mg/dL (ref 6–23)
CO2: 31 mEq/L (ref 19–32)
Calcium: 9.3 mg/dL (ref 8.4–10.5)
Chloride: 104 mEq/L (ref 96–112)
Creatinine, Ser: 0.94 mg/dL (ref 0.40–1.50)
GFR: 100.26 mL/min (ref >60.00)
Glucose, Bld: 102 mg/dL — ABNORMAL HIGH (ref 70–99)
Potassium: 4.6 mEq/L (ref 3.5–5.1)
Sodium: 141 mEq/L (ref 135–145)
Total Bilirubin: 0.4 mg/dL (ref 0.2–1.2)
Total Protein: 7.1 g/dL (ref 6.0–8.3)

## 2021-08-29 MED ORDER — METOPROLOL SUCCINATE ER 50 MG PO TB24
50.0000 mg | ORAL_TABLET | Freq: Every day | ORAL | 1 refills | Status: DC
Start: 2021-08-29 — End: 2022-06-16
  Filled 2021-08-29: qty 90, 90d supply, fill #0
  Filled 2022-01-01: qty 90, 90d supply, fill #1

## 2021-08-29 NOTE — Assessment & Plan Note (Signed)
Here for CPX ?Hypertension, tachycardia: ?BP today slightly elevated, at home is also slightly elevated (148/85, 158/91, 135/88) thus Dx is  HTN.  Adderall is likely a contributing factor. ?Plan: Start metoprolol XL 50, monitor BPs, low-salt diet.  Reassess in 4 months. ?ADD: On Adderall, previously tried Strattera but he was intolerant. ?RTC 4 months ?

## 2021-08-29 NOTE — Assessment & Plan Note (Signed)
-   Td  2020. ?-COVID vaccines booster benefits d/w pt  ?- CCS: Patient's mother was found to have multiple polyps, her gastroenterologist recommended children to have a colonoscopy.  Refer to GI. ?- Labs:   CMP, FLP, hep C ?-Diet and exercise: Does not plan to see the wellness clinic as previously recommended, rec a  structured diet (weight watchers?  Noom?  Calorie counting?). ?- EtOH: Patient reports he drinks 2-4 servings during the weekends (Friday Saturday and Sunday).  Denies drinking anything during the weekdays.  Encouraged moderation, anything more than 2 servings a day is not healthy.  I ask him if he is concerned about his alcohol intake and he said no. ? ?

## 2021-08-29 NOTE — Patient Instructions (Addendum)
I recommend you a structured diet: ?weight watchers?  Noom?  Calorie counting?  ? ?Start  metoprolol ?Watch the salt intake ?Check the  blood pressure regularly ?BP GOAL is between 110/65 and  135/85. ?If it is consistently higher or lower, let me know ?  ? ?GO TO THE LAB : Get the blood work   ? ? ?GO TO THE FRONT DESK, PLEASE SCHEDULE YOUR APPOINTMENTS ?Come back for a checkup in 4 months ? ? ?DASH Eating Plan ?DASH stands for Dietary Approaches to Stop Hypertension. The DASH eating plan is a healthy eating plan that has been shown to: ?Reduce high blood pressure (hypertension). ?Reduce your risk for type 2 diabetes, heart disease, and stroke. ?Help with weight loss. ?What are tips for following this plan? ?Reading food labels ?Check food labels for the amount of salt (sodium) per serving. Choose foods with less than 5 percent of the Daily Value of sodium. Generally, foods with less than 300 milligrams (mg) of sodium per serving fit into this eating plan. ?To find whole grains, look for the word "whole" as the first word in the ingredient list. ?Shopping ?Buy products labeled as "low-sodium" or "no salt added." ?Buy fresh foods. Avoid canned foods and pre-made or frozen meals. ?Cooking ?Avoid adding salt when cooking. Use salt-free seasonings or herbs instead of table salt or sea salt. Check with your health care provider or pharmacist before using salt substitutes. ?Do not fry foods. Cook foods using healthy methods such as baking, boiling, grilling, roasting, and broiling instead. ?Cook with heart-healthy oils, such as olive, canola, avocado, soybean, or sunflower oil. ?Meal planning ? ?Eat a balanced diet that includes: ?4 or more servings of fruits and 4 or more servings of vegetables each day. Try to fill one-half of your plate with fruits and vegetables. ?6-8 servings of whole grains each day. ?Less than 6 oz (170 g) of lean meat, poultry, or fish each day. A 3-oz (85-g) serving of meat is about the same  size as a deck of cards. One egg equals 1 oz (28 g). ?2-3 servings of low-fat dairy each day. One serving is 1 cup (237 mL). ?1 serving of nuts, seeds, or beans 5 times each week. ?2-3 servings of heart-healthy fats. Healthy fats called omega-3 fatty acids are found in foods such as walnuts, flaxseeds, fortified milks, and eggs. These fats are also found in cold-water fish, such as sardines, salmon, and mackerel. ?Limit how much you eat of: ?Canned or prepackaged foods. ?Food that is high in trans fat, such as some fried foods. ?Food that is high in saturated fat, such as fatty meat. ?Desserts and other sweets, sugary drinks, and other foods with added sugar. ?Full-fat dairy products. ?Do not salt foods before eating. ?Do not eat more than 4 egg yolks a week. ?Try to eat at least 2 vegetarian meals a week. ?Eat more home-cooked food and less restaurant, buffet, and fast food. ?Lifestyle ?When eating at a restaurant, ask that your food be prepared with less salt or no salt, if possible. ?If you drink alcohol: ?Limit how much you use to: ?0-1 drink a day for women who are not pregnant. ?0-2 drinks a day for men. ?Be aware of how much alcohol is in your drink. In the [place] one drink equals one 12 oz bottle of beer (355 mL), one 5 oz glass of wine (148 mL), or one 1? oz glass of hard liquor (44 mL). ?General information ?Avoid eating more than 2,300 mg of  salt a day. If you have hypertension, you may need to reduce your sodium intake to 1,500 mg a day. ?Work with your health care provider to maintain a healthy body weight or to lose weight. Ask what an ideal weight is for you. ?Get at least 30 minutes of exercise that causes your heart to beat faster (aerobic exercise) most days of the week. Activities may include walking, swimming, or biking. ?Work with your health care provider or dietitian to adjust your eating plan to your individual calorie needs. ?What foods should I eat? ?Fruits ?All fresh, dried, or frozen  fruit. Canned fruit in natural juice (without added sugar). ?Vegetables ?Fresh or frozen vegetables (raw, steamed, roasted, or grilled). Low-sodium or reduced-sodium tomato and vegetable juice. Low-sodium or reduced-sodium tomato sauce and tomato paste. Low-sodium or reduced-sodium canned vegetables. ?Grains ?Whole-grain or whole-wheat bread. Whole-grain or whole-wheat pasta. Brown rice. Orpah Cobbatmeal. Quinoa. Bulgur. Whole-grain and low-sodium cereals. Pita bread. Low-fat, low-sodium crackers. Whole-wheat flour tortillas. ?Meats and other proteins ?Skinless chicken or Mal[place]iturkey. Ground chicken or [place]urkey. Pork with fat trimmed off. Fish and seafood. Egg whites. Dried beans, peas, or lentils. Unsalted nuts, nut butters, and seeds. Unsalted canned beans. Lean cuts of beef with fat trimmed off. Low-sodium, lean precooked or cured meat, such as sausages or meat loaves. ?Dairy ?Low-fat (1%) or fat-free (skim) milk. Reduced-fat, low-fat, or fat-free cheeses. Nonfat, low-sodium ricotta or cottage cheese. Low-fat or nonfat yogurt. Low-fat, low-sodium cheese. ?Fats and oils ?Soft margarine without trans fats. Vegetable oil. Reduced-fat, low-fat, or light mayonnaise and salad dressings (reduced-sodium). Canola, safflower, olive, avocado, soybean, and sunflower oils. Avocado. ?Seasonings and condiments ?Herbs. Spices. Seasoning mixes without salt. ?Other foods ?Unsalted popcorn and pretzels. Fat-free sweets. ?The items listed above may not be a complete list of foods and beverages you can eat. Contact a dietitian for more information. ?What foods should I avoid? ?Fruits ?Canned fruit in a light or heavy syrup. Fried fruit. Fruit in cream or butter sauce. ?Vegetables ?Creamed or fried vegetables. Vegetables in a cheese sauce. Regular canned vegetables (not low-sodium or reduced-sodium). Regular canned tomato sauce and paste (not low-sodium or reduced-sodium). Regular tomato and vegetable juice (not low-sodium or reduced-sodium).  Rosita FirePickles. Olives. ?Grains ?Baked goods made with fat, such as croissants, muffins, or some breads. Dry pasta or rice meal packs. ?Meats and other proteins ?Fatty cuts of meat. Ribs. Fried meat. Tomasa BlaseBacon. Bologna, salami, and other precooked or cured meats, such as sausages or meat loaves. Fat from the back of a pig (fatback). Bratwurst. Salted nuts and seeds. Canned beans with added salt. Canned or smoked fish. Whole eggs or egg yolks. Chicken or [place]urkey with skin. ?Dairy ?Whole or 2% milk, cream, and half-and-half. Whole or full-fat cream cheese. Whole-fat or sweetened yogurt. Full-fat cheese. Nondairy creamers. Whipped toppings. Processed cheese and cheese spreads. ?Fats and oils ?Butter. Stick margarine. Lard. Shortening. Ghee. Bacon fat. Tropical oils, such as coconut, palm kernel, or palm oil. ?Seasonings and condiments ?Onion salt, garlic salt, seasoned salt, table salt, and sea salt. Worcestershire sauce. Tartar sauce. Barbecue sauce. Teriyaki sauce. Soy sauce, including reduced-sodium. Steak sauce. Canned and packaged gravies. Fish sauce. Oyster sauce. Cocktail sauce. Store-bought horseradish. Ketchup. Mustard. Meat flavorings and tenderizers. Bouillon cubes. Hot sauces. Pre-made or packaged marinades. Pre-made or packaged taco seasonings. Relishes. Regular salad dressings. ?Other foods ?Salted popcorn and pretzels. ?The items listed above may not be a complete list of foods and beverages you should avoid. Contact a dietitian for more information. ?Where to find more information ?  National Heart, Lung, and Blood Institute: PopSteam.is ?American Heart Association: www.heart.org ?Academy of Nutrition and Dietetics: www.eatright.org ?National Kidney Foundation: www.kidney.org ?Summary ?The DASH eating plan is a healthy eating plan that has been shown to reduce high blood pressure (hypertension). It may also reduce your risk for type 2 diabetes, heart disease, and stroke. ?When on the DASH eating plan, aim to  eat more fresh fruits and vegetables, whole grains, lean proteins, low-fat dairy, and heart-healthy fats. ?With the DASH eating plan, you should limit salt (sodium) intake to 2,300 mg a day. If you have hypertension,

## 2021-08-29 NOTE — Progress Notes (Signed)
? ?Subjective:  ? ? Patient ID: David Buckley, male    DOB: 1980-11-05, 41 y.o.   MRN: [id number] ? ?DOS:  08/29/2021 ?Type of visit - description: CPX ? ?In general feels well. ?Ambulatory BPs continue to be slightly elevated. ?Has not been able to lose much weight. ? ?Review of Systems ? ?Other than above, a 14 point review of systems is negative  ? ?  ? ? ?Past Medical History:  ?Diagnosis Date  ? ADD (attention deficit disorder)   ? Hypertension 08/03/2020  ? Pure hypercholesterolemia 08/03/2020  ? Tic disorder   ? Chronic  ? ? ?Past Surgical History:  ?Procedure Laterality Date  ? HERNIA REPAIR Left ~ 2009  ? KNEE ARTHROSCOPY Right 2017  ? MRI    ? L5-S1 problem---recvd nerve root block, denied Sx  ? TYMPANOSTOMY TUBE PLACEMENT  2004  ? VASECTOMY  03/2018  ? ?Social History  ? ?Socioeconomic History  ? Marital status: Married  ?  Spouse name: Not on file  ? Number of children: 2  ? Years of education: Not on file  ? Highest education level: Not on file  ?Occupational History  ? Occupation: Multimedia programmer estate  appraisal  ?Tobacco Use  ? Smoking status: Never  ? Smokeless tobacco: Never  ?Substance and Sexual Activity  ? Alcohol use: Yes  ? Drug use: No  ? Sexual activity: Not on file  ?Other Topics Concern  ? Not on file  ?Social History Narrative  ? Household -- pt, wife, 2 children  ?   ?   ?   ? ?Social Determinants of Health  ? ?Financial Resource Strain: Not on file  ?Food Insecurity: Not on file  ?Transportation Needs: Not on file  ?Physical Activity: Not on file  ?Stress: Not on file  ?Social Connections: Not on file  ?Intimate Partner Violence: Not on file  ? ? ?Current Outpatient Medications  ?Medication Instructions  ? amphetamine-dextroamphetamine (ADDERALL XR) 10 MG 24 hr capsule 10 mg, Oral, Daily PRN  ? amphetamine-dextroamphetamine (ADDERALL XR) 10 MG 24 hr capsule Take 1 capsule by mouth daily as needed.  ? metoprolol succinate (TOPROL-XL) 50 mg, Oral, Daily, Take with or immediately following a  meal.  ? ? ?   ?Objective:  ? Physical Exam ?BP 138/90 (BP Location: Left Arm, Patient Position: Sitting, Cuff Size: Normal)   Pulse (!) 107   Temp 98.3 ?F (36.8 ?C) (Oral)   Resp 16   Ht 6' (1.829 m)   Wt 290 lb (131.5 kg)   SpO2 98%   BMI 39.33 kg/m?  ?General: ?Well developed, NAD, BMI noted ?Neck: No  thyromegaly  ?HEENT:  ?Normocephalic . Face symmetric, atraumatic ?Lungs:  ?CTA B ?Normal respiratory effort, no intercostal retractions, no accessory muscle use. ?Heart: RRR,  no murmur.  ?Abdomen:  ?Not distended, soft, non-tender. No rebound or rigidity.   ?Lower extremities: no pretibial edema bilaterally  ?Skin: Exposed areas without rash. Not pale. Not jaundice ?Neurologic:  ?alert & oriented X3.  ?Speech normal, gait appropriate for age and unassisted ?Strength symmetric and appropriate for age.  ?Psych: ?Cognition and judgment appear intact.  ?Cooperative with normal attention span and concentration.  ?Behavior appropriate. ?No anxious or depressed appearing. ? ?   ?Assessment   ? ? Assessment ?HTN, tachycardia ADD ?Tic disorder (d/t Cylert?) ?Back pain: 11/2014, saw Ortho, local injections, improved ?Tachycardia: saw cards, d/t ADD meds?  Echo 12-2019: Normal. ? ?PLAN: ?Here for CPX ?Hypertension, tachycardia: ?BP today slightly elevated,  at home is also slightly elevated (148/85, 158/91, 135/88) thus Dx is  HTN.  Adderall is likely a contributing factor. ?Plan: Start metoprolol XL 50, monitor BPs, low-salt diet.  Reassess in 4 months. ?ADD: On Adderall, previously tried Strattera but he was intolerant. ?RTC 4 months ? ?  ?This visit occurred during the SARS-CoV-2 public health emergency.  Safety protocols were in place, including screening questions prior to the visit, additional usage of staff PPE, and extensive cleaning of exam room while observing appropriate contact time as indicated for disinfecting solutions.  ? ?

## 2021-08-30 LAB — HEPATITIS C ANTIBODY
Hepatitis C Ab: NONREACTIVE
SIGNAL TO CUT-OFF: <0.02 (ref <1.00)

## 2021-09-08 ENCOUNTER — Other Ambulatory Visit (HOSPITAL_COMMUNITY): Payer: Self-pay

## 2021-11-01 ENCOUNTER — Other Ambulatory Visit (HOSPITAL_COMMUNITY): Payer: Self-pay

## 2021-11-01 ENCOUNTER — Telehealth: Payer: Self-pay | Admitting: Internal Medicine

## 2021-11-01 MED ORDER — AMPHETAMINE-DEXTROAMPHET ER 10 MG PO CP24
10.0000 mg | ORAL_CAPSULE | Freq: Every day | ORAL | 0 refills | Status: DC | PRN
  Filled 2021-11-01: qty 30, 30d supply, fill #0

## 2021-11-01 NOTE — Telephone Encounter (Signed)
Requesting:adderall xr 10 mg Contract:05/18/20 UDS:05/02/21 Last Visit:08/29/21 Next Visit:12/29/21 Last Refill:07/25/21  Please Advise

## 2021-11-01 NOTE — Telephone Encounter (Signed)
PDMP okay, Rx sent 

## 2021-11-16 ENCOUNTER — Other Ambulatory Visit (HOSPITAL_BASED_OUTPATIENT_CLINIC_OR_DEPARTMENT_OTHER): Payer: Self-pay

## 2021-11-16 ENCOUNTER — Ambulatory Visit (INDEPENDENT_AMBULATORY_CARE_PROVIDER_SITE_OTHER): Payer: 59 | Admitting: Gastroenterology

## 2021-11-16 ENCOUNTER — Encounter: Payer: Self-pay | Admitting: Gastroenterology

## 2021-11-16 VITALS — BP 136/86 | HR 115 | Ht 72.0 in | Wt 283.5 lb

## 2021-11-16 DIAGNOSIS — Z1211 Encounter for screening for malignant neoplasm of colon: Secondary | ICD-10-CM | POA: Diagnosis not present

## 2021-11-16 DIAGNOSIS — Z8371 Family history of colonic polyps: Secondary | ICD-10-CM

## 2021-11-16 MED ORDER — CLENPIQ 10-3.5-12 MG-GM -GM/175ML PO SOLN
1.0000 | Freq: Once | ORAL | 0 refills | Status: AC
  Filled 2021-11-16: qty 350, 1d supply, fill #0

## 2021-11-16 NOTE — Patient Instructions (Addendum)
If you are age 42 or older, your body mass index should be between 23-30. Your Body mass index is 38.45 kg/m. If this is out of the aforementioned range listed, please consider follow up with your Primary Care Provider.  If you are age 77 or younger, your body mass index should be between 19-25. Your Body mass index is 38.45 kg/m. If this is out of the aformentioned range listed, please consider follow up with your Primary Care Provider.   __________________________________________________________  The [facility] providers would like to encourage you to use [facility] to communicate with providers for non-urgent requests or questions.  Due to long hold times on the telephone, sending your provider a message by [facility] may be a faster and more efficient way to get a response.  Please allow 48 business hours for a response.  Please remember that this is for non-urgent requests.   Due to recent changes in healthcare laws, you may see the results of your imaging and laboratory studies on MyChart before your provider has had a chance to review them.  We understand that in some cases there may be results that are confusing or concerning to you. Not all laboratory results come back in the same time frame and the provider may be waiting for multiple results in order to interpret others.  Please give [place] 48 hours in order for your provider to thoroughly review all the results before contacting the office for clarification of your results.    You have been scheduled for a colonoscopy. Please follow written instructions given to you at your visit today.  Please pick up your prep supplies at the pharmacy within the next 1-3 days. If you use inhalers (even only as needed), please bring them with you on the day of your procedure.   We have sent the following medications to your pharmacy for you to pick up at your convenience: Clenpiq   Thank you for choosing me and [facility].  Vito Cirigliano, D.O.

## 2021-11-16 NOTE — Progress Notes (Signed)
Chief Complaint: Discuss colon cancer screening, family history of colon polyps   Referring Provider:     Wanda Plump, MD   HPI:     David Buckley is a 41 y.o. male with a history of HTN, hyperlipidemia, ADD, referred to the Gastroenterology Clinic for evaluation of colon cancer screening.  He reports his mother was diagnosed with multiple advanced colon polyps, and her Gastroenterologist recommended children to start CRC screening early.  Maternal grandmother also with colon polyps.  He is otherwise without active GI symptoms.  Denies hematochezia, melena, abdominal pain, change in bowel habits.    He had a colonoscopy in 2005 at an outside facility notable for hemorrhoids, but does not recall whether or not there were polyps.        Latest Ref Rng & Units 05/02/2021    4:12 PM 05/18/2020    8:28 AM 05/15/2019    9:39 AM  CBC  WBC 4.0 - 10.5 K/uL 6.2  3.9  4.8   Hemoglobin 13.0 - 17.0 g/dL 19.3  79.0  24.0   Hematocrit 39.0 - 52.0 % 45.6  43.3  46.5   Platelets 150.0 - 400.0 K/uL 254.0  226.0  243.0         Latest Ref Rng & Units 08/29/2021    9:57 AM 05/18/2020    8:28 AM 05/15/2019    9:39 AM  CMP  Glucose 70 - 99 mg/dL 973  94  92   BUN 6 - 23 mg/dL 16  17  18    Creatinine 0.40 - 1.50 mg/dL  5.32  9.92   Sodium 135 - 145 mEq/L 141  139  140   Potassium 3.5 - 5.1 mEq/L 4.6  4.2  4.8   Chloride 96 - 112 mEq/L 104  102  103   CO2 19 - 32 mEq/L 31  29  29    Calcium 8.4 - 10.5 mg/dL 9.3  9.5  9.9   Total Protein 6.0 - 8.3 g/dL 7.1  7.2  7.8   Total Bilirubin 0.2 - 1.2 mg/dL 0.4  0.7  0.9   Alkaline Phos 39 - 117 U/L 57  56  69   AST 0 - 37 U/L 26  28  27    ALT 0 - 53 U/L 48  47  47      Past Medical History:  Diagnosis Date   ADD (attention deficit disorder)    Hypertension 08/03/2020   Pure hypercholesterolemia 08/03/2020   Tic disorder    Chronic     Past Surgical History:  Procedure Laterality Date   COLONOSCOPY  2005   HERNIA  REPAIR Left ~ 2009   KNEE ARTHROSCOPY Right 2017   MRI     L5-S1 problem---recvd nerve root block, denied Sx   TYMPANOSTOMY TUBE PLACEMENT  2004   VASECTOMY  03/2018   Family History  Problem Relation Age of Onset   Colon polyps Mother    Colon polyps Maternal Grandmother    Diabetes Maternal Grandfather    Diabetes Other        GM   Lung cancer Paternal Uncle    Prostate cancer Neg Hx    Colon cancer Neg Hx    Heart attack Neg Hx    Esophageal cancer Neg Hx    Social History   Tobacco Use   Smoking status: Never   Smokeless tobacco: Never  Vaping Use  Vaping Use: Never used  Substance Use Topics   Alcohol use: Yes    Comment: occ   Drug use: No   Current Outpatient Medications  Medication Sig Dispense Refill   amphetamine-dextroamphetamine (ADDERALL XR) 10 MG 24 hr capsule Take 1 capsule by mouth daily as needed. 30 capsule 0   metoprolol succinate (TOPROL-XL) 50 MG 24 hr tablet Take 1 tablet (50 mg total) by mouth daily. Take with or immediately following a meal. 90 tablet 1   No current facility-administered medications for this visit.   No Known Allergies   Review of Systems: All systems reviewed and negative except where noted in HPI.     Physical Exam:    Wt Readings from Last 3 Encounters:  11/16/21 283 lb 8 oz (128.6 kg)  08/29/21 290 lb (131.5 kg)  05/02/21 286 lb 8 oz (130 kg)    BP 136/86   Pulse (!) 115   Ht 6' (1.829 m)   Wt 283 lb 8 oz (128.6 kg)   BMI 38.45 kg/m  Constitutional:  Pleasant, in no acute distress. Psychiatric: Normal mood and affect. Behavior is normal. Cardiovascular: Normal rate, regular rhythm. No edema Pulmonary/chest: Effort normal and breath sounds normal. No wheezing, rales or rhonchi. Abdominal: Soft, nondistended, nontender. Bowel sounds active throughout. There are no masses palpable. No hepatomegaly. Neurological: Alert and oriented to person place and time. Skin: Skin is warm and dry. No rashes  noted.   ASSESSMENT AND PLAN;   1) Colon cancer screening 2) Family history of advanced colon polyps  - Per GI societal guidelines, meets criteria to initiate early CRC screening - Schedule colonoscopy  The indications, risks, and benefits of colonoscopy were explained to the patient in detail. All questions answered, referred to the scheduler and bowel prep ordered. Further recommendations pending results of the exam.     Shellia Cleverly, DO, FACG  11/16/2021, 1:18 PM   Wanda Plump, MD

## 2021-11-17 ENCOUNTER — Encounter: Payer: Self-pay | Admitting: Gastroenterology

## 2021-11-22 ENCOUNTER — Encounter: Payer: Self-pay | Admitting: Gastroenterology

## 2021-11-22 ENCOUNTER — Ambulatory Visit (AMBULATORY_SURGERY_CENTER): Payer: 59 | Admitting: Gastroenterology

## 2021-11-22 VITALS — BP 136/85 | HR 86 | Temp 97.7°F | Resp 17 | Ht 72.0 in | Wt 283.0 lb

## 2021-11-22 DIAGNOSIS — Z8371 Family history of colonic polyps: Secondary | ICD-10-CM | POA: Diagnosis not present

## 2021-11-22 DIAGNOSIS — Z1211 Encounter for screening for malignant neoplasm of colon: Secondary | ICD-10-CM | POA: Diagnosis not present

## 2021-11-22 DIAGNOSIS — K640 First degree hemorrhoids: Secondary | ICD-10-CM

## 2021-11-22 DIAGNOSIS — K5730 Diverticulosis of large intestine without perforation or abscess without bleeding: Secondary | ICD-10-CM

## 2021-11-22 MED ORDER — SODIUM CHLORIDE 0.9 % IV SOLN: 500.0000 mL | INTRAVENOUS | Status: DC

## 2021-11-22 NOTE — Patient Instructions (Signed)
   Handouts on diverticulosis & hemorrhoids given to you today.   YOU HAD AN ENDOSCOPIC PROCEDURE TODAY AT THE [facility]:   Refer to the procedure report that was given to you for any specific questions about what was found during the examination.  If the procedure report does not answer your questions, please call your gastroenterologist to clarify.  If you requested that your care partner not be given the details of your procedure findings, then the procedure report has been included in a sealed envelope for you to review at your convenience later.  YOU SHOULD EXPECT: Some feelings of bloating in the abdomen. Passage of more gas than usual.  Walking can help get rid of the air that was put into your GI tract during the procedure and reduce the bloating. If you had a lower endoscopy (such as a colonoscopy or flexible sigmoidoscopy) you may notice spotting of blood in your stool or on the toilet paper. If you underwent a bowel prep for your procedure, you may not have a normal bowel movement for a few days.  Please Note:  You might notice some irritation and congestion in your nose or some drainage.  This is from the oxygen used during your procedure.  There is no need for concern and it should clear up in a day or so.  SYMPTOMS TO REPORT IMMEDIATELY:  Following lower endoscopy (colonoscopy or flexible sigmoidoscopy):  Excessive amounts of blood in the stool  Significant tenderness or worsening of abdominal pains  Swelling of the abdomen that is new, acute  Fever of 100F or higher   For urgent or emergent issues, a gastroenterologist can be reached at any hour by calling [phone]. Do not use MyChart messaging for urgent concerns.    DIET:  We do recommend a small meal at first, but then you may proceed to your regular diet.  Drink plenty of fluids but you should avoid alcoholic beverages for 24 hours.  ACTIVITY:  You should plan to take it easy for the rest of today and  you should NOT DRIVE or use heavy machinery until tomorrow (because of the sedation medicines used during the test).    FOLLOW UP: Our staff will call the number listed on your records 24-72 hours following your procedure to check on you and address any questions or concerns that you may have regarding the information given to you following your procedure. If we do not reach you, we will leave a message.  We will attempt to reach you two times.  During this call, we will ask if you have developed any symptoms of COVID 19. If you develop any symptoms (ie: fever, flu-like symptoms, shortness of breath, cough etc.) before then, please call [phone].  If you test positive for Covid 19 in the 2 weeks post procedure, please call and report this information to [place].    If any biopsies were taken you will be contacted by phone or by letter within the next 1-3 weeks.  Please call us at [phone] if you have not heard about the biopsies in 3 weeks.    SIGNATURES/CONFIDENTIALITY: You and/or your care partner have signed paperwork which will be entered into your electronic medical record.  These signatures attest to the fact that that the information above on your After Visit Summary has been reviewed and is understood.  Full responsibility of the confidentiality of this discharge information lies with you and/or your care-partner.

## 2021-11-22 NOTE — Op Note (Signed)
[name] Patient Name: David Buckley Procedure Date: 11/22/2021 8:39 AM MRN: [id number] Endoscopist: Doristine Locks , MD Age: 41 Referring MD:  Date of Birth: 1981/05/15 Gender: Male Account #: [id number] Procedure:                Colonoscopy Indications:              Colon cancer screening in patient at increased                            risk: Family history of 1st-degree relative with                            colon polyps (mother was diagnosed with multiple                            advanced colon polyps with recommendation for early                            screening for her children). Medicines:                Monitored Anesthesia Care Procedure:                Pre-Anesthesia Assessment:                           - Prior to the procedure, a History and Physical                            was performed, and patient medications and                            allergies were reviewed. The patient's tolerance of                            previous anesthesia was also reviewed. The risks                            and benefits of the procedure and the sedation                            options and risks were discussed with the patient.                            All questions were answered, and informed consent                            was obtained. Prior Anticoagulants: The patient has                            taken no previous anticoagulant or antiplatelet                            agents. ASA Grade Assessment: II - A patient with  mild systemic disease. After reviewing the risks                            and benefits, the patient was deemed in                            satisfactory condition to undergo the procedure.                           After obtaining informed consent, the colonoscope                            was passed under direct vision. Throughout the                            procedure, the patient's blood  pressure, pulse, and                            oxygen saturations were monitored continuously. The                            Olympus CF-HQ190L (Serial# [id number]) Colonoscope was                            introduced through the anus and advanced to the the                            terminal ileum. The colonoscopy was performed                            without difficulty. The patient tolerated the                            procedure well. The quality of the bowel                            preparation was good. The terminal ileum, ileocecal                            valve, appendiceal orifice, and rectum were                            photographed. Scope In: 8:52:35 AM Scope Out: 9:04:54 AM Scope Withdrawal Time: 0 hours 10 minutes 17 seconds  Total Procedure Duration: 0 hours 12 minutes 19 seconds  Findings:                 The perianal and digital rectal examinations were                            normal.                           A few small and large-mouthed diverticula were  found in the sigmoid colon, descending colon and                            ascending colon.                           Non-bleeding internal hemorrhoids were found during                            retroflexion. The hemorrhoids were small and Grade                            I (internal hemorrhoids that do not prolapse).                           The exam was otherwise normal throughout the                            remainder of the colon.                           The terminal ileum appeared normal. Complications:            No immediate complications. Estimated Blood Loss:     Estimated blood loss: none. Impression:               - Diverticulosis in the sigmoid colon, in the                            descending colon and in the ascending colon.                           - Non-bleeding internal hemorrhoids.                           - The examined portion of the ileum was  normal.                           - No specimens collected. Recommendation:           - Patient has a contact number available for                            emergencies. The signs and symptoms of potential                            delayed complications were discussed with the                            patient. Return to normal activities tomorrow.                            Written discharge instructions were provided to the                            patient.                           -  Resume previous diet.                           - Continue present medications.                           - Repeat colonoscopy in 5 years for screening                            purposes due to family history.                           - Return to GI office PRN. Doristine Locks, MD 11/22/2021 9:10:13 AM

## 2021-11-22 NOTE — Progress Notes (Signed)
GASTROENTEROLOGY PROCEDURE H&P NOTE   Primary Care Physician: Wanda Plump, MD    Reason for Procedure:  Colon cancer screening, family history of advanced colon polyps  Plan:    Colonoscopy  Patient is appropriate for endoscopic procedure(s) in the ambulatory ([facility]) setting.  The nature of the procedure, as well as the risks, benefits, and alternatives were carefully and thoroughly reviewed with the patient. Ample time for discussion and questions allowed. The patient understood, was satisfied, and agreed to proceed.     HPI: David Buckley is a 41 y.o. male who presents for colonoscopy for early colon cancer screening due to family history of advanced colon polyps.  Patient was most recently seen in the Gastroenterology Clinic on 11/16/2021 by me.  No interval change in medical history since that appointment. Please refer to that note for full details regarding GI history and clinical presentation.   Past Medical History:  Diagnosis Date   ADD (attention deficit disorder)    Hypertension 08/03/2020   Pure hypercholesterolemia 08/03/2020   Tic disorder    Chronic    Past Surgical History:  Procedure Laterality Date   COLONOSCOPY  2005   HERNIA REPAIR Left ~ 2009   KNEE ARTHROSCOPY Right 2017   MRI     L5-S1 problem---recvd nerve root block, denied Sx   TYMPANOSTOMY TUBE PLACEMENT  2004   VASECTOMY  03/2018    Prior to Admission medications   Medication Sig Start Date End Date Taking? Authorizing Provider  amphetamine-dextroamphetamine (ADDERALL XR) 10 MG 24 hr capsule Take 1 capsule by mouth daily as needed. 11/01/21  Yes Paz, Nolon Rod, MD  metoprolol succinate (TOPROL-XL) 50 MG 24 hr tablet Take 1 tablet (50 mg total) by mouth daily. Take with or immediately following a meal. 08/29/21   Wanda Plump, MD    Current Outpatient Medications  Medication Sig Dispense Refill   amphetamine-dextroamphetamine (ADDERALL XR) 10 MG 24 hr capsule Take 1 capsule by mouth daily as  needed. 30 capsule 0   metoprolol succinate (TOPROL-XL) 50 MG 24 hr tablet Take 1 tablet (50 mg total) by mouth daily. Take with or immediately following a meal. 90 tablet 1   Current Facility-Administered Medications  Medication Dose Route Frequency Provider Last Rate Last Admin   0.9 %  sodium chloride infusion  500 mL Intravenous Continuous Charlene Detter V, DO        Allergies as of 11/22/2021   (No Known Allergies)    Family History  Problem Relation Age of Onset   Colon polyps Mother    Colon polyps Maternal Grandmother    Diabetes Maternal Grandfather    Diabetes Other        GM   Lung cancer Paternal Uncle    Prostate cancer Neg Hx    Colon cancer Neg Hx    Heart attack Neg Hx    Esophageal cancer Neg Hx     Social History   Socioeconomic History   Marital status: Married    Spouse name: Not on file   Number of children: 2   Years of education: Not on file   Highest education level: Not on file  Occupational History   Occupation: commercial real estate  appraisal  Tobacco Use   Smoking status: Never   Smokeless tobacco: Never  Vaping Use   Vaping Use: Never used  Substance and Sexual Activity   Alcohol use: Yes    Comment: occ   Drug use: No  Sexual activity: Not on file  Other Topics Concern   Not on file  Social History Narrative   Household -- pt, wife, 2 children            Social Determinants of Health   Financial Resource Strain: Not on file  Food Insecurity: Not on file  Transportation Needs: Not on file  Physical Activity: Not on file  Stress: Not on file  Social Connections: Not on file  Intimate Partner Violence: Not on file    Physical Exam: Vital signs in last 24 hours: @BP  (!) 144/85   Pulse 92   Temp 97.7 F (36.5 C) (Temporal)   Ht 6' (1.829 m)   Wt 283 lb (128.4 kg)   SpO2 96%   BMI 38.38 kg/m  GEN: NAD EYE: Sclerae anicteric ENT: MMM CV: Non-tachycardic Pulm: CTA b/l GI: Soft, NT/ND NEURO:  Alert & Oriented  x 3   , DO [facility]   11/22/2021 8:43 AM

## 2021-11-22 NOTE — Progress Notes (Signed)
Sedate, gd SR, tolerated procedure well, VSS, report to RN 

## 2021-11-22 NOTE — Progress Notes (Signed)
Pt's states no medical or surgical changes since previsit or office visit. 

## 2021-11-23 ENCOUNTER — Telehealth: Payer: Self-pay | Admitting: *Deleted

## 2021-11-23 NOTE — Telephone Encounter (Signed)
  Follow up Call-     11/22/2021    7:40 AM  Call back number  Post procedure Call Back phone  # [phone]  Permission to leave phone message Yes     Patient questions:  Do you have a fever, pain , or abdominal swelling? No. Pain Score  0 *  Have you tolerated food without any problems? Yes.    Have you been able to return to your normal activities? Yes.    Do you have any questions about your discharge instructions: Diet   No. Medications  No. Follow up visit  No.  Do you have questions or concerns about your Care? No.  Actions: * If pain score is 4 or above: No action needed, pain <4.

## 2021-12-19 ENCOUNTER — Telehealth: Payer: Self-pay | Admitting: Internal Medicine

## 2021-12-19 MED ORDER — AMPHETAMINE-DEXTROAMPHET ER 10 MG PO CP24
10.0000 mg | ORAL_CAPSULE | Freq: Every day | ORAL | 0 refills | Status: DC | PRN
  Filled 2021-12-19 – 2021-12-20 (×2): qty 30, 30d supply, fill #0

## 2021-12-19 MED ORDER — AMPHETAMINE-DEXTROAMPHET ER 10 MG PO CP24
10.0000 mg | ORAL_CAPSULE | Freq: Every day | ORAL | 0 refills | Status: DC | PRN
  Filled 2021-12-19: qty 30, 30d supply, fill #0

## 2021-12-19 NOTE — Telephone Encounter (Signed)
Requesting: Adderall XR 10mg   Contract:  05/18/20 UDS: 05/02/21 Last Visit: 08/29/21 Next Visit: 12/29/21 Last Refill: 11/01/21 #30 and 0RF  Please Advise

## 2021-12-19 NOTE — Telephone Encounter (Signed)
Pdmp ok, rf sent x 2

## 2021-12-20 ENCOUNTER — Other Ambulatory Visit (HOSPITAL_COMMUNITY): Payer: Self-pay

## 2021-12-21 ENCOUNTER — Ambulatory Visit: Payer: 59 | Admitting: Internal Medicine

## 2021-12-21 ENCOUNTER — Encounter: Payer: Self-pay | Admitting: Internal Medicine

## 2021-12-21 VITALS — BP 132/86 | HR 100 | Temp 98.4°F | Resp 16 | Ht 72.0 in | Wt 281.4 lb

## 2021-12-21 DIAGNOSIS — J029 Acute pharyngitis, unspecified: Secondary | ICD-10-CM | POA: Diagnosis not present

## 2021-12-21 DIAGNOSIS — I10 Essential (primary) hypertension: Secondary | ICD-10-CM

## 2021-12-21 DIAGNOSIS — Z79899 Other long term (current) drug therapy: Secondary | ICD-10-CM

## 2021-12-21 DIAGNOSIS — F988 Other specified behavioral and emotional disorders with onset usually occurring in childhood and adolescence: Secondary | ICD-10-CM | POA: Diagnosis not present

## 2021-12-21 NOTE — Progress Notes (Signed)
   Subjective:    Patient ID: David Buckley, male    DOB: Oct 23, 1980, 41 y.o.   MRN: [id number]  DOS:  12/21/2021 Type of visit - description: Acute  Symptoms started yesterday: Had moderate to severe sore throat. No fever chills. No cough. No runny nose or sore throat. He did go fishing for several hours noted that the air quality was poor.  He clears his throat frequently.  His daughter had sore throat last week, he does not know if she tested positive for strep   Review of Systems See above   Past Medical History:  Diagnosis Date   ADD (attention deficit disorder)    Hypertension 08/03/2020   Pure hypercholesterolemia 08/03/2020   Tic disorder    Chronic    Past Surgical History:  Procedure Laterality Date   COLONOSCOPY  2005   HERNIA REPAIR Left ~ 2009   KNEE ARTHROSCOPY Right 2017   MRI     L5-S1 problem---recvd nerve root block, denied Sx   TYMPANOSTOMY TUBE PLACEMENT  2004   VASECTOMY  03/2018    Current Outpatient Medications  Medication Instructions   amphetamine-dextroamphetamine (ADDERALL XR) 10 MG 24 hr capsule Take 1 capsule by mouth once a day as needed.   amphetamine-dextroamphetamine (ADDERALL XR) 10 MG 24 hr capsule Take 1 capsule by mouth once a day as needed.   metoprolol succinate (TOPROL-XL) 50 mg, Oral, Daily, Take with or immediately following a meal.       Objective:   Physical Exam BP 132/86   Pulse 100   Temp 98.4 F (36.9 C) (Oral)   Resp 16   Ht 6' (1.829 m)   Wt 281 lb 6 oz (127.6 kg)   SpO2 95%   BMI 38.16 kg/m  General:   Well developed, NAD, BMI noted. HEENT:  Normocephalic . Face symmetric, atraumatic TMs: Normal Nose not congested Throat: Symmetric, throat is slightly red but no white patches. Neck: No significant lymphadenopathy Lungs:  CTA B Normal respiratory effort, no intercostal retractions, no accessory muscle use. Heart: RRR,  no murmur.  Lower extremities: no pretibial edema bilaterally  Skin: Not pale.  Not jaundice Neurologic:  alert & oriented X3.  Speech normal, gait appropriate for age and unassisted Psych--  Cognition and judgment appear intact.  Cooperative with normal attention span and concentration.  Behavior appropriate. No anxious or depressed appearing.      Assessment    Assessment HTN, Tachycardia  ADD Tic disorder (d/t Cylert?) Back pain: 11/2014, saw Ortho, local injections, improved Tachycardia: saw cards, d/t ADD meds?  Echo 12-2019: Normal.  PLAN: Sore throat:  With no fever or viral syndrome type of picture.  No rapid strep test available, a culture was sent. Advised patient to check for COVID today and in a couple of days. Tylenol as needed, call if no better. ADD: On Adderall, UDS and contract today. HTN, tachycardia: Started beta-blockers for control of BP, BP today is okay, encouraged to check ambulatory readings.  Heart rate is 100, slightly better than before.  Reassess need to increase beta-blockers when he comes back. Preventive care: Rec COVID and flu shots, see AVS RTC CPX 08-2022

## 2021-12-21 NOTE — Patient Instructions (Addendum)
Take Tylenol as needed for sore throat  Check yourself for COVID today and in a couple of days.   Call if not gradually better  Once better recommend a covid booster (bivalent) at your pharmacy.  Get a flu shot every fall   Check the  blood pressure regularly BP GOAL is between 110/65 and  135/85. If it is consistently higher or lower, let me know   GO TO THE LAB : Provide a urine sample   GO TO THE FRONT DESK, PLEASE SCHEDULE YOUR APPOINTMENTS Come back for a physical exam by 08-2022

## 2021-12-21 NOTE — Assessment & Plan Note (Signed)
Sore throat:  With no fever or viral syndrome type of picture.  No rapid strep test available, a culture was sent. Advised patient to check for COVID today and in a couple of days. Tylenol as needed, call if no better. ADD: On Adderall, UDS and contract today. HTN, tachycardia: Started beta-blockers for control of BP, BP today is okay, encouraged to check ambulatory readings.  Heart rate is 100, slightly better than before.  Reassess need to increase beta-blockers when he comes back. Preventive care: Rec COVID and flu shots, see AVS RTC CPX 08-2022

## 2021-12-22 ENCOUNTER — Encounter: Payer: Self-pay | Admitting: Internal Medicine

## 2021-12-23 ENCOUNTER — Ambulatory Visit
Admission: RE | Admit: 2021-12-23 | Discharge: 2021-12-23 | Disposition: A | Discharge status: Home / Self Care | Payer: 59 | Source: Ambulatory Visit | Attending: Urgent Care | Admitting: Urgent Care

## 2021-12-23 ENCOUNTER — Other Ambulatory Visit (HOSPITAL_COMMUNITY): Payer: Self-pay

## 2021-12-23 VITALS — BP 142/75 | HR 114 | Temp 98.7°F | Resp 18

## 2021-12-23 DIAGNOSIS — H9202 Otalgia, left ear: Secondary | ICD-10-CM | POA: Diagnosis not present

## 2021-12-23 DIAGNOSIS — R052 Subacute cough: Secondary | ICD-10-CM | POA: Diagnosis not present

## 2021-12-23 DIAGNOSIS — H66002 Acute suppurative otitis media without spontaneous rupture of ear drum, left ear: Secondary | ICD-10-CM

## 2021-12-23 DIAGNOSIS — R070 Pain in throat: Secondary | ICD-10-CM | POA: Diagnosis not present

## 2021-12-23 LAB — CULTURE, GROUP A STREP
MICRO NUMBER:: 13667415
SPECIMEN QUALITY:: ADEQUATE

## 2021-12-23 MED ORDER — LEVOCETIRIZINE DIHYDROCHLORIDE 5 MG PO TABS
5.0000 mg | ORAL_TABLET | Freq: Every evening | ORAL | 0 refills | Status: DC
  Filled 2021-12-23: qty 30, 30d supply, fill #0

## 2021-12-23 MED ORDER — PROMETHAZINE-DM 6.25-15 MG/5ML PO SYRP
5.0000 mL | ORAL_SOLUTION | Freq: Three times a day (TID) | ORAL | 0 refills | Status: DC | PRN
  Filled 2021-12-23: qty 100, 7d supply, fill #0

## 2021-12-23 MED ORDER — AMOXICILLIN-POT CLAVULANATE 875-125 MG PO TABS
1.0000 | ORAL_TABLET | Freq: Two times a day (BID) | ORAL | 0 refills | Status: DC
  Filled 2021-12-23: qty 20, 10d supply, fill #0

## 2021-12-23 NOTE — ED Provider Notes (Signed)
[facility]   MRN: [id number] DOB: [birth year]  Subjective:   David Buckley is a 41 y.o. male presenting for 3 day history of acute onset throat pain, wheezing at night, sinus congestion, now having left ear pain and drainage. No chest pain, shob. Has a mild cough. No history of asthma. No smoking. Saw his PCP 2 days ago and was recommended to use supportive care.   No current facility-administered medications for this encounter.  Current Outpatient Medications:    amphetamine-dextroamphetamine (ADDERALL XR) 10 MG 24 hr capsule, Take 1 capsule by mouth once a day as needed., Disp: 30 capsule, Rfl: 0   amphetamine-dextroamphetamine (ADDERALL XR) 10 MG 24 hr capsule, Take 1 capsule by mouth once a day as needed., Disp: 30 capsule, Rfl: 0   metoprolol succinate (TOPROL-XL) 50 MG 24 hr tablet, Take 1 tablet (50 mg total) by mouth daily. Take with or immediately following a meal., Disp: 90 tablet, Rfl: 1   No Known Allergies  Past Medical History:  Diagnosis Date   ADD (attention deficit disorder)    Hypertension 08/03/2020   Pure hypercholesterolemia 08/03/2020   Tic disorder    Chronic     Past Surgical History:  Procedure Laterality Date   COLONOSCOPY  2005   HERNIA REPAIR Left ~ 2009   KNEE ARTHROSCOPY Right 2017   MRI     L5-S1 problem---recvd nerve root block, denied Sx   TYMPANOSTOMY TUBE PLACEMENT  2004   VASECTOMY  03/2018    Family History  Problem Relation Age of Onset   Colon polyps Mother    Colon polyps Maternal Grandmother    Diabetes Maternal Grandfather    Diabetes Other        GM   Lung cancer Paternal Uncle    Prostate cancer Neg Hx    Colon cancer Neg Hx    Heart attack Neg Hx    Esophageal cancer Neg Hx     Social History   Tobacco Use   Smoking status: Never   Smokeless tobacco: Never  Vaping Use   Vaping Use: Never used  Substance Use Topics   Alcohol use: Yes    Comment: occ   Drug use: No     ROS   Objective:   Vitals: BP (!) 142/75 (BP Location: Left Arm)   Pulse (!) 114   Temp 98.7 F (37.1 C) (Oral)   SpO2 95%   Pulse recheck was 99-102bpm.  Physical Exam Constitutional:      General: He is not in acute distress.    Appearance: Normal appearance. He is normal weight. He is not ill-appearing, toxic-appearing or diaphoretic.  HENT:     Head: Normocephalic and atraumatic.     Right Ear: Tympanic membrane, ear canal and external ear normal. No drainage, swelling or tenderness. No middle ear effusion. There is no impacted cerumen. Tympanic membrane is not erythematous.     Left Ear: Ear canal and external ear normal. Drainage and tenderness present. No swelling. A middle ear effusion is present. There is no impacted cerumen. Tympanic membrane is not erythematous.     Nose: Nose normal. No congestion or rhinorrhea.     Mouth/Throat:     Mouth: Mucous membranes are moist.     Pharynx: Posterior oropharyngeal erythema (Postnasal drainage overlying pharynx) present. No pharyngeal swelling, oropharyngeal exudate or uvula swelling.  Eyes:     General: No scleral icterus.       Right eye: No discharge.  Left eye: No discharge.     Extraocular Movements: Extraocular movements intact.     Conjunctiva/sclera: Conjunctivae normal.  Cardiovascular:     Rate and Rhythm: Normal rate.  Pulmonary:     Effort: Pulmonary effort is normal.  Musculoskeletal:     Cervical back: Normal range of motion and neck supple. No rigidity. No muscular tenderness.  Neurological:     General: No focal deficit present.     Mental Status: He is alert and oriented to person, place, and time.  Psychiatric:        Mood and Affect: Mood normal.        Behavior: Behavior normal.     Assessment and Plan :   PDMP not reviewed this encounter.  1. Non-recurrent acute suppurative otitis media of left ear without spontaneous rupture of tympanic membrane   2. Throat pain   3. Left ear  pain   4. Subacute cough    Start Augmentin to cover for otitis media. Use supportive care otherwise.  We will avoid use of pseudoephedrine given his elevated pulse likely due to using Adderall.  Counseled patient on potential for adverse effects with medications prescribed/recommended today, ER and return-to-clinic precautions discussed, patient verbalized understanding.    Wallis Bamberg, [place] 12/23/21 [phone]

## 2021-12-23 NOTE — ED Notes (Signed)
Patient states he woke up this morning and his pillow was wet from ear drainage. Ear Pain and sore throat since Tuesday. Patients states congestion and Productive cough (started yesterday) are progressively getting worst and patient states he feel like he is wheezing.

## 2021-12-23 NOTE — ED Triage Notes (Signed)
The patient states he was seen on wednesday for a sore throat that has gotten worse with congestion, cough and left ear pain.

## 2021-12-24 LAB — DRUG MONITORING PANEL 375977 , URINE
Alcohol Metabolites: POSITIVE ng/mL — AB (ref <500)
Amphetamine: 5772 ng/mL — ABNORMAL HIGH (ref <250)
Amphetamines: POSITIVE ng/mL — AB (ref <500)
Barbiturates: NEGATIVE ng/mL (ref <300)
Benzodiazepines: NEGATIVE ng/mL (ref <100)
Cocaine Metabolite: NEGATIVE ng/mL (ref <150)
Desmethyltramadol: NEGATIVE ng/mL (ref <100)
Ethyl Glucuronide (ETG): >10000 ng/mL — ABNORMAL HIGH (ref <500)
Ethyl Sulfate (ETS): 9388 ng/mL — ABNORMAL HIGH (ref <100)
Marijuana Metabolite: 26 ng/mL — ABNORMAL HIGH (ref <5)
Marijuana Metabolite: POSITIVE ng/mL — AB (ref <20)
Methamphetamine: NEGATIVE ng/mL (ref <250)
Opiates: NEGATIVE ng/mL (ref <100)
Oxycodone: NEGATIVE ng/mL (ref <100)
Tramadol: NEGATIVE ng/mL (ref <100)

## 2021-12-24 LAB — DM TEMPLATE

## 2021-12-29 ENCOUNTER — Ambulatory Visit: Payer: 59 | Admitting: Internal Medicine

## 2021-12-30 ENCOUNTER — Ambulatory Visit: Payer: 59 | Admitting: Internal Medicine

## 2021-12-30 ENCOUNTER — Other Ambulatory Visit (HOSPITAL_BASED_OUTPATIENT_CLINIC_OR_DEPARTMENT_OTHER): Payer: Self-pay

## 2021-12-30 ENCOUNTER — Encounter: Payer: Self-pay | Admitting: Internal Medicine

## 2021-12-30 VITALS — BP 144/72 | HR 110 | Temp 98.3°F | Resp 16 | Ht 72.0 in | Wt 281.5 lb

## 2021-12-30 DIAGNOSIS — H6692 Otitis media, unspecified, left ear: Secondary | ICD-10-CM

## 2021-12-30 MED ORDER — NEOMYCIN-POLYMYXIN-HC 3.5-10000-1 OT SUSP
3.0000 [drp] | Freq: Three times a day (TID) | OTIC | 0 refills | Status: DC
  Filled 2021-12-30: qty 10, 22d supply, fill #0

## 2021-12-30 NOTE — Progress Notes (Unsigned)
   Subjective:    Patient ID: David Buckley, male    DOB: 03/30/1981, 41 y.o.   MRN: [id number]  DOS:  12/30/2021 Type of visit - description: UC f/u Went to urgent care 12/23/2021:  "3 day history of acute onset throat pain, wheezing at night, sinus congestion, now having left ear pain and drainage"  Prescribed Augmentin.  At this point he feels better.  Ear pain much improved.  Had a lot of ear discharge, that seems to be subsiding. Hearing is still muffled.  No fever or chills. Had a negative COVID     Review of Systems See above   Past Medical History:  Diagnosis Date   ADD (attention deficit disorder)    Hypertension 08/03/2020   Pure hypercholesterolemia 08/03/2020   Tic disorder    Chronic    Past Surgical History:  Procedure Laterality Date   COLONOSCOPY  2005   HERNIA REPAIR Left ~ 2009   KNEE ARTHROSCOPY Right 2017   MRI     L5-S1 problem---recvd nerve root block, denied Sx   TYMPANOSTOMY TUBE PLACEMENT  2004   VASECTOMY  03/2018    Current Outpatient Medications  Medication Instructions   amoxicillin-clavulanate (AUGMENTIN) 875-125 MG tablet 1 tablet, Oral, 2 times daily   amphetamine-dextroamphetamine (ADDERALL XR) 10 MG 24 hr capsule Take 1 capsule by mouth once a day as needed.   amphetamine-dextroamphetamine (ADDERALL XR) 10 MG 24 hr capsule Take 1 capsule by mouth once a day as needed.   levocetirizine (XYZAL) 5 mg, Oral, Every evening   metoprolol succinate (TOPROL-XL) 50 mg, Oral, Daily, Take with or immediately following a meal.   promethazine-dextromethorphan (PROMETHAZINE-DM) 6.25-15 MG/5ML syrup 5 mLs, Oral, 3 times daily PRN       Objective:   Physical Exam BP (!) 144/72   Pulse (!) 110   Temp 98.3 F (36.8 C) (Oral)   Resp 16   Ht 6' (1.829 m)   Wt 281 lb 8 oz (127.7 kg)   SpO2 96%   BMI 38.18 kg/m  General:   Well developed, NAD, BMI noted. HEENT:  Normocephalic . Face symmetric, atraumatic R ear: TM normal L ear: Canal with  a small amount of debris but no swelling.  TM with no obvious perforation, slightly red Throat: Symmetric, no red Lower extremities: no pretibial edema bilaterally  Skin: Not pale. Not jaundice Neurologic:  alert & oriented X3.  Speech normal, gait appropriate for age and unassisted Psych--  Cognition and judgment appear intact.  Cooperative with normal attention span and concentration.  Behavior appropriate. No anxious or depressed appearing.      Assessment     Assessment HTN, Tachycardia  ADD Tic disorder (d/t Cylert?) Back pain: 11/2014, saw Ortho, local injections, improved Tachycardia: saw cards, d/t ADD meds?  Echo 12-2019: Normal.  PLAN: Left suppurative otitis media: Was seen here with URI, subsequently went to the urgent care and diagnosed with otitis media, on antibiotics, improving, still that hearing is slightly muffled. Plan: Finish antibiotics, otic drops suspension for few days, call if not completely well in 10 days.

## 2021-12-30 NOTE — Patient Instructions (Signed)
Finish antibiotics by mouth  Use the eardrops for 5 days  Call if not back to normal in 10 days

## 2022-01-01 NOTE — Assessment & Plan Note (Signed)
Left suppurative otitis media: Was seen here with URI, subsequently went to the urgent care and diagnosed with otitis media, on antibiotics, improving, still that hearing is slightly muffled. Plan: Finish antibiotics, otic drops suspension for few days, call if not completely well in 10 days.

## 2022-01-02 ENCOUNTER — Other Ambulatory Visit (HOSPITAL_BASED_OUTPATIENT_CLINIC_OR_DEPARTMENT_OTHER): Payer: Self-pay

## 2022-01-26 ENCOUNTER — Other Ambulatory Visit (HOSPITAL_BASED_OUTPATIENT_CLINIC_OR_DEPARTMENT_OTHER): Payer: Self-pay

## 2022-01-26 ENCOUNTER — Telehealth: Payer: 59 | Admitting: Family Medicine

## 2022-01-26 DIAGNOSIS — U071 COVID-19: Secondary | ICD-10-CM | POA: Diagnosis not present

## 2022-01-26 MED ORDER — MOLNUPIRAVIR EUA 200MG CAPSULE
4.0000 | ORAL_CAPSULE | Freq: Two times a day (BID) | ORAL | 0 refills | Status: AC
  Filled 2022-01-26: qty 40, 5d supply, fill #0

## 2022-01-26 MED ORDER — FLUTICASONE PROPIONATE 50 MCG/ACT NA SUSP
2.0000 | Freq: Every day | NASAL | 0 refills | Status: DC
  Filled 2022-01-26: qty 16, 30d supply, fill #0

## 2022-01-26 NOTE — Patient Instructions (Signed)
David Buckley, thank you for joining Freddy Finner, NP for today's virtual visit.  While this provider is not your primary care provider (PCP), if your PCP is located in our provider database this encounter information will be shared with them immediately following your visit.  Consent: (Patient) David Buckley provided verbal consent for this virtual visit at the beginning of the encounter.  Current Medications:  Current Outpatient Medications:    fluticasone (FLONASE) 50 MCG/ACT nasal spray, Place 2 sprays into both nostrils daily., Disp: 16 g, Rfl: 0   molnupiravir EUA (LAGEVRIO) 200 mg CAPS capsule, Take 4 capsules (800 mg total) by mouth 2 (two) times daily for 5 days., Disp: 40 capsule, Rfl: 0   amoxicillin-clavulanate (AUGMENTIN) 875-125 MG tablet, Take 1 tablet by mouth 2 (two) times daily., Disp: 20 tablet, Rfl: 0   amphetamine-dextroamphetamine (ADDERALL XR) 10 MG 24 hr capsule, Take 1 capsule by mouth once a day as needed., Disp: 30 capsule, Rfl: 0   amphetamine-dextroamphetamine (ADDERALL XR) 10 MG 24 hr capsule, Take 1 capsule by mouth once a day as needed., Disp: 30 capsule, Rfl: 0   levocetirizine (XYZAL) 5 MG tablet, Take 1 tablet (5 mg total) by mouth every evening., Disp: 30 tablet, Rfl: 0   metoprolol succinate (TOPROL-XL) 50 MG 24 hr tablet, Take 1 tablet (50 mg total) by mouth daily. Take with or immediately following a meal., Disp: 90 tablet, Rfl: 1   neomycin-polymyxin-hydrocortisone (CORTISPORIN) 3.5-10000-1 OTIC suspension, Place 3 drops into the left ear 3 (three) times daily., Disp: 10 mL, Rfl: 0   promethazine-dextromethorphan (PROMETHAZINE-DM) 6.25-15 MG/5ML syrup, Take 5 mLs by mouth 3 (three) times daily as needed for cough., Disp: 100 mL, Rfl: 0   Medications ordered in this encounter:  Meds ordered this encounter  Medications   fluticasone (FLONASE) 50 MCG/ACT nasal spray    Sig: Place 2 sprays into both nostrils daily.    Dispense:  16 g    Refill:  0     Order Specific Question:   Supervising Provider    Answer:   MILLER, BRIAN [[id number]]   molnupiravir EUA (LAGEVRIO) 200 mg CAPS capsule    Sig: Take 4 capsules (800 mg total) by mouth 2 (two) times daily for 5 days.    Dispense:  40 capsule    Refill:  0    Order Specific Question:   Supervising Provider    Answer:   Hyacinth Meeker, BRIAN [[id number]]     *If you need refills on other medications prior to your next appointment, please contact your pharmacy*  Follow-Up: Call back or seek an in-person evaluation if the symptoms worsen or if the condition fails to improve as anticipated.  Other Instructions  Please keep well-hydrated and get plenty of rest. Start a saline nasal rinse to flush out your nasal passages. You can use plain Mucinex to help thin congestion. If you have a humidifier, running in the bedroom at night. I want you to start OTC vitamin D3 1000 units daily, vitamin C 1000 mg daily, and a zinc supplement. Please take prescribed medications as directed.  You have been enrolled in a MyChart symptom monitoring program. Please answer these questions daily so we can keep track of how you are doing.  You were to quarantine for 5 days from onset of your symptoms.  After day 5, if you have had no fever and you are feeling better, you can end quarantine but need to mask for an additional 5 days. After  day 5 if you have a fever or are having significant symptoms, please quarantine for full 10 days.  If you note any worsening of symptoms, any significant shortness of breath or any chest pain, please seek ER evaluation ASAP.  Please do not delay care!  COVID-19: What to Do if You Are Sick If you test positive and are an older adult or someone who is at high risk of getting very sick from COVID-19, treatment may be available. Contact a healthcare provider right away after a positive test to determine if you are eligible, even if your symptoms are mild right now. You can also visit a Test to  Treat location and, if eligible, receive a prescription from a provider. Don't delay: Treatment must be started within the first few days to be effective. If you have a fever, cough, or other symptoms, you might have COVID-19. Most people have mild illness and are able to recover at home. If you are sick: Keep track of your symptoms. If you have an emergency warning sign (including trouble breathing), call 911. Steps to help prevent the spread of COVID-19 if you are sick If you are sick with COVID-19 or think you might have COVID-19, follow the steps below to care for yourself and to help protect other people in your home and community. Stay home except to get medical care Stay home. Most people with COVID-19 have mild illness and can recover at home without medical care. Do not leave your home, except to get medical care. Do not visit public areas and do not go to places where you are unable to wear a mask. Take care of yourself. Get rest and stay hydrated. Take over-the-counter medicines, such as acetaminophen, to help you feel better. Stay in touch with your doctor. Call before you get medical care. Be sure to get care if you have trouble breathing, or have any other emergency warning signs, or if you think it is an emergency. Avoid public transportation, ride-sharing, or taxis if possible. Get tested If you have symptoms of COVID-19, get tested. While waiting for test results, stay away from others, including staying apart from those living in your household. Get tested as soon as possible after your symptoms start. Treatments may be available for people with COVID-19 who are at risk for becoming very sick. Don't delay: Treatment must be started early to be effective--some treatments must begin within 5 days of your first symptoms. Contact your healthcare provider right away if your test result is positive to determine if you are eligible. Self-tests are one of several options for testing for the  virus that causes COVID-19 and may be more convenient than laboratory-based tests and point-of-care tests. Ask your healthcare provider or your local health department if you need help interpreting your test results. You can visit your state, tribal, local, and territorial health department's website to look for the latest local information on testing sites. Separate yourself from other people As much as possible, stay in a specific room and away from other people and pets in your home. If possible, you should use a separate bathroom. If you need to be around other people or animals in or outside of the home, wear a well-fitting mask. Tell your close contacts that they may have been exposed to COVID-19. An infected person can spread COVID-19 starting 48 hours (or 2 days) before the person has any symptoms or tests positive. By letting your close contacts know they may have been exposed to COVID-19,  you are helping to protect everyone. See COVID-19 and Animals if you have questions about pets. If you are diagnosed with COVID-19, someone from the health department may call you. Answer the call to slow the spread. Monitor your symptoms Symptoms of COVID-19 include fever, cough, or other symptoms. Follow care instructions from your healthcare provider and local health department. Your local health authorities may give instructions on checking your symptoms and reporting information. When to seek emergency medical attention Look for emergency warning signs* for COVID-19. If someone is showing any of these signs, seek emergency medical care immediately: Trouble breathing Persistent pain or pressure in the chest New confusion Inability to wake or stay awake Pale, gray, or blue-colored skin, lips, or nail beds, depending on skin tone *This list is not all possible symptoms. Please call your medical provider for any other symptoms that are severe or concerning to you. Call 911 or call ahead to your local  emergency facility: Notify the operator that you are seeking care for someone who has or may have COVID-19. Call ahead before visiting your doctor Call ahead. Many medical visits for routine care are being postponed or done by phone or telemedicine. If you have a medical appointment that cannot be postponed, call your doctor's office, and tell them you have or may have COVID-19. This will help the office protect themselves and other patients. If you are sick, wear a well-fitting mask You should wear a mask if you must be around other people or animals, including pets (even at home). Wear a mask with the best fit, protection, and comfort for you. You don't need to wear the mask if you are alone. If you can't put on a mask (because of trouble breathing, for example), cover your coughs and sneezes in some other way. Try to stay at least 6 feet away from other people. This will help protect the people around you. Masks should not be placed on young children under age 15 years, anyone who has trouble breathing, or anyone who is not able to remove the mask without help. Cover your coughs and sneezes Cover your mouth and nose with a tissue when you cough or sneeze. Throw away used tissues in a lined trash can. Immediately wash your hands with soap and water for at least 20 seconds. If soap and water are not available, clean your hands with an alcohol-based hand sanitizer that contains at least 60% alcohol. Clean your hands often Wash your hands often with soap and water for at least 20 seconds. This is especially important after blowing your nose, coughing, or sneezing; going to the bathroom; and before eating or preparing food. Use hand sanitizer if soap and water are not available. Use an alcohol-based hand sanitizer with at least 60% alcohol, covering all surfaces of your hands and rubbing them together until they feel dry. Soap and water are the best option, especially if hands are visibly dirty. Avoid  touching your eyes, nose, and mouth with unwashed hands. Handwashing Tips Avoid sharing personal household items Do not share dishes, drinking glasses, cups, eating utensils, towels, or bedding with other people in your home. Wash these items thoroughly after using them with soap and water or put in the dishwasher. Clean surfaces in your home regularly Clean and disinfect high-touch surfaces (for example, doorknobs, tables, handles, light switches, and countertops) in your "sick room" and bathroom. In shared spaces, you should clean and disinfect surfaces and items after each use by the person who is ill.  If you are sick and cannot clean, a caregiver or other person should only clean and disinfect the area around you (such as your bedroom and bathroom) on an as needed basis. Your caregiver/other person should wait as long as possible (at least several hours) and wear a mask before entering, cleaning, and disinfecting shared spaces that you use. Clean and disinfect areas that may have blood, stool, or body fluids on them. Use household cleaners and disinfectants. Clean visible dirty surfaces with household cleaners containing soap or detergent. Then, use a household disinfectant. Use a product from [employer] List N: Disinfectants for Coronavirus (COVID-19). Be sure to follow the instructions on the label to ensure safe and effective use of the product. Many products recommend keeping the surface wet with a disinfectant for a certain period of time (look at "contact time" on the product label). You may also need to wear personal protective equipment, such as gloves, depending on the directions on the product label. Immediately after disinfecting, wash your hands with soap and water for 20 seconds. For completed guidance on cleaning and disinfecting your home, visit Complete Disinfection Guidance. Take steps to improve ventilation at home Improve ventilation (air flow) at home to help prevent from spreading  COVID-19 to other people in your household. Clear out COVID-19 virus particles in the air by opening windows, using air filters, and turning on fans in your home. Use this interactive tool to learn how to improve air flow in your home. When you can be around others after being sick with COVID-19 Deciding when you can be around others is different for different situations. Find out when you can safely end home isolation. For any additional questions about your care, contact your healthcare provider or state or local health department. 08/24/2020 Content source: [facility] ([facility]), Division of Viral Diseases This information is not intended to replace advice given to you by your health care provider. Make sure you discuss any questions you have with your health care provider. Document Revised: 10/07/2020 Document Reviewed: 10/07/2020 Elsevier Patient Education  2022 [employer].      If you have been instructed to have an in-person evaluation today at a local Urgent Care facility, please use the link below. It will take you to a list of all of our available [facility], including address, phone number and hours of operation. Please do not delay care.  [facility]  If you or a family member do not have a primary care provider, use the link below to schedule a visit and establish care. When you choose a [facility] primary care physician or advanced practice provider, you gain a long-term partner in health. Find a Primary Care Provider  Learn more about [facility]'s in-office and virtual care options: [facility] - Get Care Now

## 2022-01-26 NOTE — Progress Notes (Signed)
Virtual Visit Consent   David Buckley, you are scheduled for a virtual visit with a [facility] provider today. Just as with appointments in the office, your consent must be obtained to participate. Your consent will be active for this visit and any virtual visit you may have with one of our providers in the next 365 days. If you have a MyChart account, a copy of this consent can be sent to you electronically.  As this is a virtual visit, video technology does not allow for your provider to perform a traditional examination. This may limit your provider's ability to fully assess your condition. If your provider identifies any concerns that need to be evaluated in person or the need to arrange testing (such as labs, EKG, etc.), we will make arrangements to do so. Although advances in technology are sophisticated, we cannot ensure that it will always work on either your end or our end. If the connection with a video visit is poor, the visit may have to be switched to a telephone visit. With either a video or telephone visit, we are not always able to ensure that we have a secure connection.  By engaging in this virtual visit, you consent to the provision of healthcare and authorize for your insurance to be billed (if applicable) for the services provided during this visit. Depending on your insurance coverage, you may receive a charge related to this service.  I need to obtain your verbal consent now. Are you willing to proceed with your visit today? David Buckley has provided verbal consent on 01/26/2022 for a virtual visit (video or telephone). Freddy Finner, NP  Date: 01/26/2022 1:41 PM  Virtual Visit via Video Note   I, Freddy Finner, connected with  David Buckley  ([id number], May 07, 1981) on 01/26/22 at  1:30 PM EDT by a video-enabled telemedicine application and verified that I am speaking with the correct person using two identifiers.  Location: Patient: Virtual Visit Location Patient:  Home Provider: Virtual Visit Location Provider: Home Office   I discussed the limitations of evaluation and management by telemedicine and the availability of in person appointments. The patient expressed understanding and agreed to proceed.    History of Present Illness: David Buckley is a 41 y.o. who identifies as a male who was assigned male at birth, and is being seen today for covid + Yesterday started with cough and scratchy throat, diarrhea. Tested this afternoon at lunch- and tested + for covid. Oldest daughter was + for covid last week. Has increased congestion and body aches now. Just getting over ear infection pressure.    Problems:  Patient Active Problem List   Diagnosis Date Noted   Hypertension 08/03/2020   Pure hypercholesterolemia 08/03/2020   PCP NOTES >>>>>>>>>>>>>>>.. 01/20/2016   Annual physical exam 06/03/2014   Obesity 01/24/2011   TIC DISORDER, UNSPECIFIED 04/08/2007   Attention deficit disorder 11/08/2006    Allergies: No Known Allergies Medications:  Current Outpatient Medications:    amoxicillin-clavulanate (AUGMENTIN) 875-125 MG tablet, Take 1 tablet by mouth 2 (two) times daily., Disp: 20 tablet, Rfl: 0   amphetamine-dextroamphetamine (ADDERALL XR) 10 MG 24 hr capsule, Take 1 capsule by mouth once a day as needed., Disp: 30 capsule, Rfl: 0   amphetamine-dextroamphetamine (ADDERALL XR) 10 MG 24 hr capsule, Take 1 capsule by mouth once a day as needed., Disp: 30 capsule, Rfl: 0   levocetirizine (XYZAL) 5 MG tablet, Take 1 tablet (5 mg total) by mouth every  evening., Disp: 30 tablet, Rfl: 0   metoprolol succinate (TOPROL-XL) 50 MG 24 hr tablet, Take 1 tablet (50 mg total) by mouth daily. Take with or immediately following a meal., Disp: 90 tablet, Rfl: 1   neomycin-polymyxin-hydrocortisone (CORTISPORIN) 3.5-10000-1 OTIC suspension, Place 3 drops into the left ear 3 (three) times daily., Disp: 10 mL, Rfl: 0   promethazine-dextromethorphan (PROMETHAZINE-DM)  6.25-15 MG/5ML syrup, Take 5 mLs by mouth 3 (three) times daily as needed for cough., Disp: 100 mL, Rfl: 0  Observations/Objective: Patient is well-developed, well-nourished in no acute distress.  Resting comfortably  at home.  Head is normocephalic, atraumatic.  No labored breathing.  Speech is clear and coherent with logical content.  Patient is alert and oriented at baseline.    Assessment and Plan: 1. COVID-19  - fluticasone (FLONASE) 50 MCG/ACT nasal spray; Place 2 sprays into both nostrils daily.  Dispense: 16 g; Refill: 0 - molnupiravir EUA (LAGEVRIO) 200 mg CAPS capsule; Take 4 capsules (800 mg total) by mouth 2 (two) times daily for 5 days.  Dispense: 40 capsule; Refill: 0  -no red flags for in person eval need at this time  -rest -hydration -OTC reviewed in conjunction with above -info on AVS   Reviewed side effects, risks and benefits of medication.  .  Patient acknowledged agreement and understanding of the plan.    Past Medical, Surgical, Social History, Allergies, and Medications have been Reviewed.    Follow Up Instructions: I discussed the assessment and treatment plan with the patient. The patient was provided an opportunity to ask questions and all were answered. The patient agreed with the plan and demonstrated an understanding of the instructions.  A copy of instructions were sent to the patient via MyChart unless otherwise noted below.    The patient was advised to call back or seek an in-person evaluation if the symptoms worsen or if the condition fails to improve as anticipated.  Time:  I spent 15 minutes with the patient via telehealth technology discussing the above problems/concerns.    Freddy Finner, NP

## 2022-01-30 ENCOUNTER — Encounter: Payer: Self-pay | Admitting: Family Medicine

## 2022-02-08 ENCOUNTER — Telehealth: Payer: Self-pay | Admitting: Internal Medicine

## 2022-02-08 ENCOUNTER — Other Ambulatory Visit (HOSPITAL_COMMUNITY): Payer: Self-pay

## 2022-02-08 MED ORDER — AMPHETAMINE-DEXTROAMPHET ER 10 MG PO CP24
10.0000 mg | ORAL_CAPSULE | Freq: Every day | ORAL | 0 refills | Status: DC | PRN
  Filled 2022-02-08 – 2022-03-27 (×2): qty 30, 30d supply, fill #0

## 2022-02-08 MED ORDER — AMPHETAMINE-DEXTROAMPHET ER 10 MG PO CP24
10.0000 mg | ORAL_CAPSULE | Freq: Every day | ORAL | 0 refills | Status: DC | PRN
  Filled 2022-02-08: qty 30, 30d supply, fill #0

## 2022-02-08 NOTE — Telephone Encounter (Signed)
Requesting: Adderall XR 10mg   Contract:12/21/21 UDS: 12/21/21 Last Visit: 12/30/21 Next Visit: 08/22/22 Last Refill: 12/19/21 #30 and 0RF (x2)  Please Advise

## 2022-02-08 NOTE — Telephone Encounter (Signed)
PDMP review, prescription sent 

## 2022-03-06 ENCOUNTER — Other Ambulatory Visit (HOSPITAL_BASED_OUTPATIENT_CLINIC_OR_DEPARTMENT_OTHER): Payer: Self-pay

## 2022-03-06 DIAGNOSIS — H7292 Unspecified perforation of tympanic membrane, left ear: Secondary | ICD-10-CM | POA: Insufficient documentation

## 2022-03-06 MED ORDER — OFLOXACIN 0.3 % OT SOLN
OTIC | 1 refills | Status: DC
  Filled 2022-03-06: qty 5, 12d supply, fill #0

## 2022-03-20 ENCOUNTER — Other Ambulatory Visit (HOSPITAL_BASED_OUTPATIENT_CLINIC_OR_DEPARTMENT_OTHER): Payer: Self-pay

## 2022-03-20 DIAGNOSIS — S2231XA Fracture of one rib, right side, initial encounter for closed fracture: Secondary | ICD-10-CM | POA: Diagnosis not present

## 2022-03-20 MED ORDER — MELOXICAM 15 MG PO TABS
15.0000 mg | ORAL_TABLET | Freq: Every day | ORAL | 2 refills | Status: DC
  Filled 2022-03-20: qty 30, 30d supply, fill #0
  Filled 2022-05-18: qty 30, 30d supply, fill #1

## 2022-03-27 ENCOUNTER — Other Ambulatory Visit (HOSPITAL_COMMUNITY): Payer: Self-pay

## 2022-05-02 DIAGNOSIS — H7292 Unspecified perforation of tympanic membrane, left ear: Secondary | ICD-10-CM | POA: Diagnosis not present

## 2022-05-18 ENCOUNTER — Telehealth: Payer: Self-pay | Admitting: Internal Medicine

## 2022-05-18 ENCOUNTER — Other Ambulatory Visit (HOSPITAL_COMMUNITY): Payer: Self-pay

## 2022-05-18 ENCOUNTER — Other Ambulatory Visit: Payer: Self-pay

## 2022-05-18 MED ORDER — AMPHETAMINE-DEXTROAMPHET ER 10 MG PO CP24
10.0000 mg | ORAL_CAPSULE | Freq: Every day | ORAL | 0 refills | Status: DC | PRN
  Filled 2022-05-18: qty 30, 30d supply, fill #0

## 2022-05-18 MED ORDER — AMPHETAMINE-DEXTROAMPHET ER 10 MG PO CP24
10.0000 mg | ORAL_CAPSULE | Freq: Every day | ORAL | 0 refills | Status: DC | PRN
  Filled 2022-05-18 – 2022-07-05 (×2): qty 30, 30d supply, fill #0

## 2022-05-18 NOTE — Telephone Encounter (Signed)
Requesting: Adderall XR 10mg   Contract: 12/21/21 UDS: 12/21/21 Last Visit: 12/21/21 Next Visit: 08/22/22 Last Refill: 02/08/22 #30 and 0RF (x2)  Please Advise

## 2022-05-18 NOTE — Telephone Encounter (Signed)
PDMP okay, Rx sent 

## 2022-05-24 DIAGNOSIS — H7292 Unspecified perforation of tympanic membrane, left ear: Secondary | ICD-10-CM | POA: Diagnosis not present

## 2022-05-31 ENCOUNTER — Ambulatory Visit
Admission: EM | Admit: 2022-05-31 | Discharge: 2022-05-31 | Disposition: A | Discharge status: Home / Self Care | Payer: 59 | Attending: Urgent Care | Admitting: Urgent Care

## 2022-05-31 ENCOUNTER — Ambulatory Visit (INDEPENDENT_AMBULATORY_CARE_PROVIDER_SITE_OTHER): Payer: 59

## 2022-05-31 ENCOUNTER — Other Ambulatory Visit (HOSPITAL_COMMUNITY): Payer: Self-pay

## 2022-05-31 DIAGNOSIS — B349 Viral infection, unspecified: Secondary | ICD-10-CM

## 2022-05-31 DIAGNOSIS — R000 Tachycardia, unspecified: Secondary | ICD-10-CM

## 2022-05-31 DIAGNOSIS — R0602 Shortness of breath: Secondary | ICD-10-CM | POA: Diagnosis not present

## 2022-05-31 MED ORDER — PROMETHAZINE-DM 6.25-15 MG/5ML PO SYRP
2.5000 mL | ORAL_SOLUTION | Freq: Three times a day (TID) | ORAL | 0 refills | Status: DC | PRN
  Filled 2022-05-31: qty 100, 13d supply, fill #0

## 2022-05-31 MED ORDER — LEVOCETIRIZINE DIHYDROCHLORIDE 5 MG PO TABS
5.0000 mg | ORAL_TABLET | Freq: Every evening | ORAL | 0 refills | Status: DC
  Filled 2022-05-31: qty 30, 30d supply, fill #0

## 2022-05-31 MED ORDER — IPRATROPIUM BROMIDE 0.03 % NA SOLN
2.0000 | Freq: Two times a day (BID) | NASAL | 0 refills | Status: DC
  Filled 2022-05-31: qty 30, 30d supply, fill #0

## 2022-05-31 NOTE — ED Triage Notes (Addendum)
Pt c/o cough, head/chest congestion x 2-3 days-states he feels [facility] when he lies down-NAD-steady gait

## 2022-05-31 NOTE — Discharge Instructions (Addendum)
We will manage this as a viral illness. For sore throat or cough try using a honey-based tea. Use 3 teaspoons of honey with juice squeezed from half lemon. Place shaved pieces of ginger into 1/2-1 cup of water and warm over stove top. Then mix the ingredients and repeat every 4 hours as needed. Please take ibuprofen 600mg  every 6 hours with food alternating with OR taken together with Tylenol 500mg -650mg  every 6 hours for throat pain, fevers, aches and pains. Hydrate very well with at least 2 liters of water. Eat light meals such as soups (chicken and noodles, vegetable, chicken and wild rice).  Do not eat foods that you are allergic to.  Taking an antihistamine like Zyrtec can help against postnasal drainage, sinus congestion which can cause sinus pain, sinus headaches, throat pain, painful swallowing, coughing.  You can take this together with the nasal spray as needed for the same kind of nasal drip, congestion.  Use cough medication as needed.

## 2022-05-31 NOTE — ED Provider Notes (Signed)
[facility]  Note:  This document was prepared using Conservation officer, historic buildings and may include unintentional dictation errors.  MRN: [id number] DOB: [birth year]  Subjective:   David Buckley is a 41 y.o. male presenting for 3-day history of acute onset persistent head congestion, sinus congestion, coughing, achiness.  Has also had diarrhea at night and intermittent shortness of breath throughout the day worse in the evening.  No history of respiratory disorders.  Patient generally has an elevated pulse as he takes a stimulant medication.  He does also use metoprolol.  He did 2 COVID tests at home and both were negative.  No current facility-administered medications for this encounter.  Current Outpatient Medications:    amoxicillin-clavulanate (AUGMENTIN) 875-125 MG tablet, Take 1 tablet by mouth 2 (two) times daily., Disp: 20 tablet, Rfl: 0   amphetamine-dextroamphetamine (ADDERALL XR) 10 MG 24 hr capsule, Take 1 capsule by mouth once a day as needed., Disp: 30 capsule, Rfl: 0   amphetamine-dextroamphetamine (ADDERALL XR) 10 MG 24 hr capsule, Take 1 capsule by mouth once a day as needed., Disp: 30 capsule, Rfl: 0   fluticasone (FLONASE) 50 MCG/ACT nasal spray, Place 2 sprays into both nostrils daily., Disp: 16 g, Rfl: 0   levocetirizine (XYZAL) 5 MG tablet, Take 1 tablet (5 mg total) by mouth every evening., Disp: 30 tablet, Rfl: 0   meloxicam (MOBIC) 15 MG tablet, Take 1 tablet by mouth once a day, Disp: 30 tablet, Rfl: 2   metoprolol succinate (TOPROL-XL) 50 MG 24 hr tablet, Take 1 tablet (50 mg total) by mouth daily. Take with or immediately following a meal., Disp: 90 tablet, Rfl: 1   neomycin-polymyxin-hydrocortisone (CORTISPORIN) 3.5-10000-1 OTIC suspension, Place 3 drops into the left ear 3 (three) times daily., Disp: 10 mL, Rfl: 0   ofloxacin (FLOXIN) 0.3 % OTIC solution, Instill 4 drops to left ear twice daily if draining, Disp: 5 mL, Rfl: 1    promethazine-dextromethorphan (PROMETHAZINE-DM) 6.25-15 MG/5ML syrup, Take 5 mLs by mouth 3 (three) times daily as needed for cough., Disp: 100 mL, Rfl: 0   No Known Allergies  Past Medical History:  Diagnosis Date   ADD (attention deficit disorder)    Hypertension 08/03/2020   Pure hypercholesterolemia 08/03/2020   Tic disorder    Chronic     Past Surgical History:  Procedure Laterality Date   COLONOSCOPY  2005   HERNIA REPAIR Left ~ 2009   KNEE ARTHROSCOPY Right 2017   MRI     L5-S1 problem---recvd nerve root block, denied Sx   TYMPANOSTOMY TUBE PLACEMENT  2004   VASECTOMY  03/2018    Family History  Problem Relation Age of Onset   Colon polyps Mother    Colon polyps Maternal Grandmother    Diabetes Maternal Grandfather    Diabetes Other        GM   Lung cancer Paternal Uncle    Prostate cancer Neg Hx    Colon cancer Neg Hx    Heart attack Neg Hx    Esophageal cancer Neg Hx     Social History   Tobacco Use   Smoking status: Never   Smokeless tobacco: Never  Vaping Use   Vaping Use: Never used  Substance Use Topics   Alcohol use: Yes    Comment: occ   Drug use: No    ROS   Objective:   Vitals: BP (!) 140/74 (BP Location: Left Arm)   Pulse (!) 124   Temp  99.7 F (37.6 C) (Oral)   Resp 18   SpO2 95%   Wt Readings from Last 3 Encounters:  12/30/21 281 lb 8 oz (127.7 kg)  12/21/21 281 lb 6 oz (127.6 kg)  11/22/21 283 lb (128.4 kg)   Temp Readings from Last 3 Encounters:  05/31/22 99.7 F (37.6 C) (Oral)  12/30/21 98.3 F (36.8 C) (Oral)  12/23/21 98.7 F (37.1 C) (Oral)   BP Readings from Last 3 Encounters:  05/31/22 (!) 140/74  12/30/21 (!) 144/72  12/23/21 (!) 142/75   Pulse Readings from Last 3 Encounters:  05/31/22 (!) 124  12/30/21 (!) 110  12/23/21 (!) 114   Physical Exam Constitutional:      General: He is not in acute distress.    Appearance: Normal appearance. He is well-developed and normal weight. He is not ill-appearing,  toxic-appearing or diaphoretic.  HENT:     Head: Normocephalic and atraumatic.     Right Ear: Tympanic membrane, ear canal and external ear normal. No drainage, swelling or tenderness. No middle ear effusion. There is no impacted cerumen. Tympanic membrane is not erythematous or bulging.     Left Ear: Tympanic membrane, ear canal and external ear normal. No drainage, swelling or tenderness.  No middle ear effusion. There is no impacted cerumen. Tympanic membrane is not erythematous or bulging.     Nose: Congestion present. No rhinorrhea.     Mouth/Throat:     Mouth: Mucous membranes are moist.     Pharynx: No oropharyngeal exudate or posterior oropharyngeal erythema.  Eyes:     General: No scleral icterus.       Right eye: No discharge.        Left eye: No discharge.     Extraocular Movements: Extraocular movements intact.     Conjunctiva/sclera: Conjunctivae normal.  Cardiovascular:     Rate and Rhythm: Normal rate and regular rhythm.     Heart sounds: Normal heart sounds. No murmur heard.    No friction rub. No gallop.  Pulmonary:     Effort: Pulmonary effort is normal. No respiratory distress.     Breath sounds: Normal breath sounds. No stridor. No wheezing, rhonchi or rales.  Musculoskeletal:     Cervical back: Normal range of motion and neck supple. No rigidity. No muscular tenderness.  Neurological:     General: No focal deficit present.     Mental Status: He is alert and oriented to person, place, and time.  Psychiatric:        Mood and Affect: Mood normal.        Behavior: Behavior normal.        Thought Content: Thought content normal.     DG Chest 2 View  Result Date: 05/31/2022 CLINICAL DATA:  Shortness of breath, head and chest congestion for 2-3 days, orthopnea EXAM: CHEST - 2 VIEW COMPARISON:  11/09/2014 FINDINGS: Normal heart size, mediastinal contours, and pulmonary vascularity. Eventration LEFT diaphragm unchanged. Lungs clear. No pleural effusion or  pneumothorax. Bones unremarkable. IMPRESSION: No acute abnormalities. Electronically Signed   By: Ulyses Southward M.D.   On: 05/31/2022 09:23     Assessment and Plan :   PDMP not reviewed this encounter.  1. Acute viral syndrome   2. Tachycardia, unspecified     Patient did 2 COVID tests at home and were negative and does not want repeats.  Declines repeat testing.  [facility] lab is not able to run flu test and therefore cannot test for flu right now.  Chest x-ray negative. Suspect viral URI, viral syndrome. Physical exam findings reassuring and vital signs stable for discharge. Advised supportive care, offered symptomatic relief. Counseled patient on potential for adverse effects with medications prescribed/recommended today, ER and return-to-clinic precautions discussed, patient verbalized understanding.     Wallis Bamberg, [place] 05/31/22 [phone]

## 2022-06-16 ENCOUNTER — Other Ambulatory Visit: Payer: Self-pay | Admitting: Internal Medicine

## 2022-06-16 ENCOUNTER — Other Ambulatory Visit (HOSPITAL_BASED_OUTPATIENT_CLINIC_OR_DEPARTMENT_OTHER): Payer: Self-pay

## 2022-06-16 MED ORDER — METOPROLOL SUCCINATE ER 50 MG PO TB24
50.0000 mg | ORAL_TABLET | Freq: Every day | ORAL | 1 refills | Status: DC
  Filled 2022-06-16: qty 90, 90d supply, fill #0
  Filled 2022-10-10: qty 90, 90d supply, fill #1

## 2022-07-05 ENCOUNTER — Other Ambulatory Visit (HOSPITAL_COMMUNITY): Payer: Self-pay

## 2022-08-22 ENCOUNTER — Encounter: Payer: Self-pay | Admitting: Internal Medicine

## 2022-08-22 ENCOUNTER — Ambulatory Visit (INDEPENDENT_AMBULATORY_CARE_PROVIDER_SITE_OTHER): Payer: 59 | Admitting: Internal Medicine

## 2022-08-22 ENCOUNTER — Other Ambulatory Visit (HOSPITAL_BASED_OUTPATIENT_CLINIC_OR_DEPARTMENT_OTHER): Payer: Self-pay

## 2022-08-22 VITALS — BP 138/94 | HR 107 | Temp 98.0°F | Resp 18 | Ht 72.0 in | Wt 296.0 lb

## 2022-08-22 DIAGNOSIS — E7800 Pure hypercholesterolemia, unspecified: Secondary | ICD-10-CM

## 2022-08-22 DIAGNOSIS — Z0000 Encounter for general adult medical examination without abnormal findings: Secondary | ICD-10-CM | POA: Diagnosis not present

## 2022-08-22 DIAGNOSIS — Z79899 Other long term (current) drug therapy: Secondary | ICD-10-CM | POA: Diagnosis not present

## 2022-08-22 DIAGNOSIS — I10 Essential (primary) hypertension: Secondary | ICD-10-CM

## 2022-08-22 DIAGNOSIS — F988 Other specified behavioral and emotional disorders with onset usually occurring in childhood and adolescence: Secondary | ICD-10-CM | POA: Diagnosis not present

## 2022-08-22 LAB — COMPREHENSIVE METABOLIC PANEL
ALT: 62 U/L — ABNORMAL HIGH (ref 0–53)
AST: 36 U/L (ref 0–37)
Albumin: 4.5 g/dL (ref 3.5–5.2)
Alkaline Phosphatase: 66 U/L (ref 39–117)
BUN: 19 mg/dL (ref 6–23)
CO2: 27 mEq/L (ref 19–32)
Calcium: 9.6 mg/dL (ref 8.4–10.5)
Chloride: 104 mEq/L (ref 96–112)
Creatinine, Ser: 0.9 mg/dL (ref 0.40–1.50)
GFR: 104.91 mL/min (ref >60.00)
Glucose, Bld: 96 mg/dL (ref 70–99)
Potassium: 4.2 mEq/L (ref 3.5–5.1)
Sodium: 140 mEq/L (ref 135–145)
Total Bilirubin: 0.6 mg/dL (ref 0.2–1.2)
Total Protein: 7.4 g/dL (ref 6.0–8.3)

## 2022-08-22 LAB — LIPID PANEL
Cholesterol: 224 mg/dL — ABNORMAL HIGH (ref 0–200)
HDL: 52.6 mg/dL (ref >39.00)
LDL Cholesterol: 132 mg/dL — ABNORMAL HIGH (ref 0–99)
NonHDL: 171.62
Total CHOL/HDL Ratio: 4
Triglycerides: 199 mg/dL — ABNORMAL HIGH (ref 0.0–149.0)
VLDL: 39.8 mg/dL (ref 0.0–40.0)

## 2022-08-22 LAB — CBC WITH DIFFERENTIAL/PLATELET
Basophils Absolute: 0.1 10*3/uL (ref 0.0–0.1)
Basophils Relative: 1.1 % (ref 0.0–3.0)
Eosinophils Absolute: 0.1 10*3/uL (ref 0.0–0.7)
Eosinophils Relative: 2.6 % (ref 0.0–5.0)
HCT: 44.3 % (ref 39.0–52.0)
Hemoglobin: 15.2 g/dL (ref 13.0–17.0)
Lymphocytes Relative: 31.2 % (ref 12.0–46.0)
Lymphs Abs: 1.5 10*3/uL (ref 0.7–4.0)
MCHC: 34.3 g/dL (ref 30.0–36.0)
MCV: 93.1 fl (ref 78.0–100.0)
Monocytes Absolute: 0.5 10*3/uL (ref 0.1–1.0)
Monocytes Relative: 10.4 % (ref 3.0–12.0)
Neutro Abs: 2.6 10*3/uL (ref 1.4–7.7)
Neutrophils Relative %: 54.7 % (ref 43.0–77.0)
Platelets: 237 10*3/uL (ref 150.0–400.0)
RBC: 4.76 Mil/uL (ref 4.22–5.81)
RDW: 13.5 % (ref 11.5–15.5)
WBC: 4.8 10*3/uL (ref 4.0–10.5)

## 2022-08-22 LAB — TSH: TSH: 1.52 u[IU]/mL (ref 0.35–5.50)

## 2022-08-22 MED ORDER — AMPHETAMINE-DEXTROAMPHET ER 10 MG PO CP24
10.0000 mg | ORAL_CAPSULE | Freq: Every day | ORAL | 0 refills | Status: DC | PRN
  Filled 2022-08-22 – 2022-10-04 (×2): qty 30, 30d supply, fill #0

## 2022-08-22 MED ORDER — AMPHETAMINE-DEXTROAMPHET ER 10 MG PO CP24
10.0000 mg | ORAL_CAPSULE | Freq: Every day | ORAL | 0 refills | Status: DC | PRN
  Filled 2022-08-22: qty 30, 30d supply, fill #0

## 2022-08-22 NOTE — Patient Instructions (Addendum)
Vaccines I recommend: Covid booster Flu shot every fall  Check the  blood pressure regularly BP GOAL is between 110/65 and  135/85. If it is consistently higher or lower, let me know     GO TO THE LAB : Get the blood work     [place], [place] back for   checkup in 4 months

## 2022-08-22 NOTE — Progress Notes (Unsigned)
Subjective:    Patient ID: David Buckley, male    DOB: 01/30/1981, 42 y.o.   MRN: [id number]  DOS:  08/22/2022 Type of visit - description: cpx  Here for CPX Last year had L suppurativa otitis, saw ENT.  Overall feels better.  Review of Systems See above   Past Medical History:  Diagnosis Date   ADD (attention deficit disorder)    Hypertension 08/03/2020   Pure hypercholesterolemia 08/03/2020   Tic disorder    Chronic    Past Surgical History:  Procedure Laterality Date   COLONOSCOPY  2005   HERNIA REPAIR Left ~ 2009   KNEE ARTHROSCOPY Right 2017   MRI     L5-S1 problem---recvd nerve root block, denied Sx   TYMPANOSTOMY TUBE PLACEMENT  2004   VASECTOMY  03/2018    Current Outpatient Medications  Medication Instructions   amoxicillin-clavulanate (AUGMENTIN) 875-125 MG tablet 1 tablet, Oral, 2 times daily   amphetamine-dextroamphetamine (ADDERALL XR) 10 MG 24 hr capsule Take 1 capsule by mouth once a day as needed.   amphetamine-dextroamphetamine (ADDERALL XR) 10 MG 24 hr capsule Take 1 capsule by mouth once a day as needed.   fluticasone (FLONASE) 50 MCG/ACT nasal spray 2 sprays, Each Nare, Daily   ipratropium (ATROVENT) 0.03 % nasal spray 2 sprays, Each Nare, 2 times daily   levocetirizine (XYZAL) 5 mg, Oral, Every evening   meloxicam (MOBIC) 15 MG tablet Take 1 tablet by mouth once a day   metoprolol succinate (TOPROL-XL) 50 mg, Oral, Daily, Take with or immediately following a meal.   neomycin-polymyxin-hydrocortisone (CORTISPORIN) 3.5-10000-1 OTIC suspension 3 drops, Left EAR, 3 times daily   ofloxacin (FLOXIN) 0.3 % OTIC solution Instill 4 drops to left ear twice daily if draining   promethazine-dextromethorphan (PROMETHAZINE-DM) 6.25-15 MG/5ML syrup 2.5 mLs, Oral, 3 times daily PRN       Objective:   Physical Exam BP (!) 144/100   Pulse (!) 107   Temp 98 F (36.7 C) (Oral)   Resp 18   Ht 6' (1.829 m)   Wt 296 lb (134.3 kg)   SpO2 97%   BMI 40.14 kg/m   General: Well developed, NAD, BMI noted Neck: No  thyromegaly  HEENT:  Normocephalic . Face symmetric, atraumatic R TM normal L TM: Has some scar tissue and perhaps a minute perf.  No discharge or redness Lungs:  CTA B Normal respiratory effort, no intercostal retractions, no accessory muscle use. Heart: RRR,  no murmur.  Abdomen:  Not distended, soft, non-tender. No rebound or rigidity.   Lower extremities: no pretibial edema bilaterally  Skin: Exposed areas without rash. Not pale. Not jaundice Neurologic:  alert & oriented X3.  Speech normal, gait appropriate for age and unassisted Strength symmetric and appropriate for age.  Psych: Cognition and judgment appear intact.  Cooperative with normal attention span and concentration.  Behavior appropriate. No anxious or depressed appearing.     Assessment   Assessment HTN, Tachycardia - Echo 12-2019: Normal. - saw cards 08-2020, BP goal 140/90 tachy cardia likely d/t ADD meds  ADD Tic disorder (d/t Cylert?) Back pain: 11/2014, saw Ortho, local injections, improved   PLAN: HTN, tachycardia: On metoprolol, has not taking metoprolol today, normal [place] BPs.  BP upon arrival 144/100, repeated: 138/90.  No ambulatory BPs. Plan: No change for now, plans to lose weight, see next.  Reassess in 4 months. Morbid obesity: BMI 40, he goes to the gym 5 times a week, has not been able  to lose weight.  He has signed with weight loss clinic and will start in few days.  They offered a structured diet.praised! ADD: Well-controlled.  PDMP okay, Adderall refilled.   RTC 4 - 6 months     - Td  2020. -COVID  vax d/w pt - Flu shot given for - [facility]: cscope 11-2021, next per GI - Labs:   CMP FLP CBC TSH -Diet and exercise: Does not plan to see the wellness clinic as previously recommended, rec a  structured diet (weight watchers?  Noom?  Calorie counting?). - EtOH: Reports drinks during the weekends only, no more than 2 or 3  servings.    7 Left suppurative otitis media: Was seen here with URI, subsequently went to the urgent care and diagnosed with otitis media, on antibiotics, improving, still that hearing is slightly muffled. Plan: Finish antibiotics, otic drops suspension for few days, call if not completely well in 10 days.

## 2022-08-23 ENCOUNTER — Encounter: Payer: Self-pay | Admitting: Internal Medicine

## 2022-08-23 NOTE — Assessment & Plan Note (Signed)
-   Td  2020. -COVID  vax d/w pt - Flu shot every fall recommended - [facility]: cscope 11-2021, next per GI - Labs:   CMP FLP CBC TSH -Diet and exercise: Will start with weight loss clinic next week.  He exercises 5 times a week - EtOH: Reports drinks during the weekends only, no more than 2 or 3 servings.

## 2022-08-23 NOTE — Assessment & Plan Note (Signed)
Here for CPX HTN, tachycardia: On metoprolol, has not taking metoprolol today, normal amb BPs.  BP upon arrival 144/100, repeated: 138/90.   Plan: No change for now, plans to lose weight, see next.  Reassess in 4 months. Morbid obesity: BMI 40, he goes to the gym 5 times a week, has not been able to lose weight.  He has signed with a weight loss clinic and will start in few days.  They offered a structured diet. praised! ADD: Well-controlled.  PDMP okay, Adderall refilled.   RTC 4 - 6 months

## 2022-08-24 LAB — DRUG MONITORING PANEL 375977 , URINE
Alcohol Metabolites: POSITIVE ng/mL — AB
Amphetamine: 2737 ng/mL — ABNORMAL HIGH
Amphetamines: POSITIVE ng/mL — AB
Barbiturates: NEGATIVE ng/mL
Benzodiazepines: NEGATIVE ng/mL
Cocaine Metabolite: NEGATIVE ng/mL
Desmethyltramadol: NEGATIVE ng/mL
Ethyl Glucuronide (ETG): >10000 ng/mL — ABNORMAL HIGH
Ethyl Sulfate (ETS): 8945 ng/mL — ABNORMAL HIGH
Marijuana Metabolite: NEGATIVE ng/mL
Methamphetamine: NEGATIVE ng/mL
Opiates: NEGATIVE ng/mL
Oxycodone: NEGATIVE ng/mL
Tramadol: NEGATIVE ng/mL

## 2022-08-24 LAB — DM TEMPLATE

## 2022-10-04 ENCOUNTER — Other Ambulatory Visit: Payer: Self-pay

## 2022-10-04 ENCOUNTER — Other Ambulatory Visit (HOSPITAL_BASED_OUTPATIENT_CLINIC_OR_DEPARTMENT_OTHER): Payer: Self-pay

## 2022-10-10 ENCOUNTER — Other Ambulatory Visit (HOSPITAL_BASED_OUTPATIENT_CLINIC_OR_DEPARTMENT_OTHER): Payer: Self-pay

## 2022-11-23 ENCOUNTER — Other Ambulatory Visit (HOSPITAL_BASED_OUTPATIENT_CLINIC_OR_DEPARTMENT_OTHER): Payer: Self-pay

## 2022-11-23 ENCOUNTER — Telehealth: Payer: Self-pay | Admitting: Internal Medicine

## 2022-11-23 MED ORDER — AMPHETAMINE-DEXTROAMPHET ER 10 MG PO CP24
10.0000 mg | ORAL_CAPSULE | Freq: Every day | ORAL | 0 refills | Status: DC | PRN
  Filled 2022-11-23: qty 30, 30d supply, fill #0

## 2022-11-23 MED ORDER — AMPHETAMINE-DEXTROAMPHET ER 10 MG PO CP24
10.0000 mg | ORAL_CAPSULE | Freq: Every day | ORAL | 0 refills | Status: DC | PRN
  Filled 2022-11-23 – 2023-01-04 (×2): qty 30, 30d supply, fill #0

## 2022-11-23 NOTE — Telephone Encounter (Signed)
PDMP okay, Rx sent 

## 2022-11-23 NOTE — Telephone Encounter (Signed)
Requesting: Adderall XR 10mg   Contract: 08/22/22 UDS: 08/22/22 Last Visit: 08/22/22 Next Visit: 02/20/23 Last Refill: 08/22/22 #30 and 0RF (x2)  Please Advise

## 2023-01-04 ENCOUNTER — Other Ambulatory Visit: Payer: Self-pay | Admitting: Internal Medicine

## 2023-01-04 ENCOUNTER — Other Ambulatory Visit (HOSPITAL_BASED_OUTPATIENT_CLINIC_OR_DEPARTMENT_OTHER): Payer: Self-pay

## 2023-01-05 ENCOUNTER — Other Ambulatory Visit: Payer: Self-pay

## 2023-01-09 ENCOUNTER — Encounter: Payer: Self-pay | Admitting: Internal Medicine

## 2023-01-13 ENCOUNTER — Other Ambulatory Visit (HOSPITAL_BASED_OUTPATIENT_CLINIC_OR_DEPARTMENT_OTHER): Payer: Self-pay

## 2023-01-13 ENCOUNTER — Ambulatory Visit
Admission: EM | Admit: 2023-01-13 | Discharge: 2023-01-13 | Disposition: A | Discharge status: Home / Self Care | Payer: 59 | Source: Home / Self Care

## 2023-01-13 DIAGNOSIS — H66002 Acute suppurative otitis media without spontaneous rupture of ear drum, left ear: Secondary | ICD-10-CM | POA: Diagnosis not present

## 2023-01-13 MED ORDER — AMOXICILLIN 875 MG PO TABS
875.0000 mg | ORAL_TABLET | Freq: Two times a day (BID) | ORAL | 0 refills | Status: AC
  Filled 2023-01-13: qty 20, 10d supply, fill #0

## 2023-01-13 NOTE — Discharge Instructions (Signed)
Start amoxicillin twice daily for 10 days.  You may take over-the-counter Tylenol or ibuprofen as needed for pain.  Lots of rest and fluids.  Please follow-up with your PCP if your symptoms or not improving.  Please go to the ER if you develop any worsening symptoms.  I hope you feel better soon!

## 2023-01-13 NOTE — ED Triage Notes (Signed)
Pt presents with c/o lt ear pain x 24 hours. Pt states he feels his ear is close to closing.

## 2023-01-13 NOTE — ED Provider Notes (Signed)
[facility]    CSN: [id number] Arrival date & time: 01/13/23  0807      History   Chief Complaint Chief Complaint  Patient presents with   Ear Pain    HPI David Buckley is a 42 y.o. male presents for evaluation of left ear pain.  Patient reports 1 day of left ear pain.  Denies any drainage from the ear, fevers or chills, hearing changes.  Does state he had URI symptoms earlier this week that have completely resolved.  No recent swimming.  Does report history of recurrent ear infections in the past.  No OTC medications have been used since onset.  No other concerns at this time.  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 01/20/2016   Annual physical exam 06/03/2014   Obesity 01/24/2011   TIC DISORDER, UNSPECIFIED 04/08/2007   Attention deficit disorder 11/08/2006    Past Surgical History:  Procedure Laterality Date   COLONOSCOPY  2005   HERNIA REPAIR Left ~ 2009   KNEE ARTHROSCOPY Right 2017   MRI     L5-S1 problem---recvd nerve root block, denied Sx   TYMPANOSTOMY TUBE PLACEMENT  2004   VASECTOMY  03/2018       Home Medications    Prior to Admission medications   Medication Sig Start Date End Date Taking? Authorizing Provider  amoxicillin (AMOXIL) 875 MG tablet Take 1 tablet (875 mg total) by mouth 2 (two) times daily for 10 days. 01/13/23 01/23/23 Yes Radford Pax, NP  amphetamine-dextroamphetamine (ADDERALL XR) 10 MG 24 hr capsule Take 1 capsule by mouth once a day as needed. 11/23/22   Wanda Plump, MD  amphetamine-dextroamphetamine (ADDERALL XR) 10 MG 24 hr capsule Take 1 capsule by mouth once a day as needed. 11/23/22   Wanda Plump, MD  levocetirizine (XYZAL) 5 MG tablet Take 1 tablet (5 mg total) by mouth every evening. 05/31/22   Wallis Bamberg, PA-C  metoprolol succinate (TOPROL-XL) 50 MG 24 hr tablet Take 1 tablet (50 mg total) by mouth daily. Take with or immediately following a meal. 06/16/22   Wanda Plump, MD    Family History Family History  Problem Relation Age of Onset   Colon polyps Mother    Colon polyps Maternal Grandmother    Diabetes Maternal Grandfather    Diabetes Other        GM   Lung cancer Paternal Uncle    Prostate cancer Neg Hx    Colon cancer Neg Hx    Heart attack Neg Hx    Esophageal cancer Neg Hx     Social History Social History   Tobacco Use   Smoking status: Never   Smokeless tobacco: Never  Vaping Use   Vaping status: Never Used  Substance Use Topics   Alcohol use: Yes    Comment: social   Drug use: No     Allergies   Patient has no known allergies.   Review of Systems Review of Systems  HENT:  Positive for ear pain.      Physical Exam Triage Vital Signs ED Triage Vitals [01/13/23 0818]  Encounter Vitals Group  BP (!) 143/88     Systolic BP Percentile      Diastolic BP Percentile      Pulse Rate 97     Resp 17     Temp 98.2 F (36.8 C)     Temp Source Oral     SpO2 96 %     Weight      Height      Head Circumference      Peak Flow      Pain Score 5     Pain Loc      Pain Education      Exclude from Growth Chart    No data found.  Updated Vital Signs BP (!) 143/88 (BP Location: Right Arm)   Pulse 97   Temp 98.2 F (36.8 C) (Oral)   Resp 17   SpO2 96%   Visual Acuity Right Eye Distance:   Left Eye Distance:   Bilateral Distance:    Right Eye Near:   Left Eye Near:    Bilateral Near:     Physical Exam Vitals and nursing note reviewed.  Constitutional:      General: He is not in acute distress.    Appearance: Normal appearance. He is not ill-appearing or toxic-appearing.  HENT:     Head: Normocephalic and atraumatic.      Right Ear: Tympanic membrane and ear canal normal.     Left Ear: Ear canal normal. Tympanic membrane is erythematous and retracted.     Nose: No congestion.     Mouth/Throat:     Mouth: Mucous membranes are moist.     Pharynx: No posterior oropharyngeal erythema.  Eyes:     Pupils: Pupils are equal, round, and reactive to light.  Cardiovascular:     Rate and Rhythm: Normal rate and regular rhythm.     Heart sounds: Normal heart sounds.  Pulmonary:     Effort: Pulmonary effort is normal.     Breath sounds: Normal breath sounds.  Musculoskeletal:     Cervical back: Normal range of motion and neck supple.  Lymphadenopathy:     Cervical: No cervical adenopathy.  Skin:    General: Skin is warm and dry.  Neurological:     General: No focal deficit present.     Mental Status: He is alert and oriented to person, place, and time.  Psychiatric:        Mood and Affect: Mood normal.        Behavior: Behavior normal.      UC Treatments / Results  Labs (all labs ordered are listed, but only abnormal results are displayed) Labs Reviewed - No data to display  EKG   Radiology No results found.  Procedures Procedures (including critical care time)  Medications Ordered in UC Medications - No data to display  Initial Impression / Assessment and Plan / UC Course  I have reviewed the triage vital signs and the nursing notes.  Pertinent labs & imaging results that were available during my care of the patient were reviewed by me and considered in my medical decision making (see chart for details).     Reviewed exam and symptoms with patient.  No red flags.  Start amoxicillin twice daily for 10 days.  OTC analgesics as needed.  PCP follow-up if symptoms do not improve.  ER precautions reviewed and patient verbalized understanding. Final Clinical Impressions(s) / UC Diagnoses   Final diagnoses:  Acute suppurative otitis media of left ear without spontaneous rupture  of tympanic  membrane, recurrence not specified     Discharge Instructions      Start amoxicillin twice daily for 10 days.  You may take over-the-counter Tylenol or ibuprofen as needed for pain.  Lots of rest and fluids.  Please follow-up with your PCP if your symptoms or not improving.  Please go to the ER if you develop any worsening symptoms.  I hope you feel better soon!    ED Prescriptions     Medication Sig Dispense Auth. Provider   amoxicillin (AMOXIL) 875 MG tablet Take 1 tablet (875 mg total) by mouth 2 (two) times daily for 10 days. 20 tablet Radford Pax, NP      PDMP not reviewed this encounter.   Radford Pax, NP 01/13/23 [phone]

## 2023-01-18 ENCOUNTER — Encounter (INDEPENDENT_AMBULATORY_CARE_PROVIDER_SITE_OTHER): Payer: Self-pay

## 2023-02-06 DIAGNOSIS — M9902 Segmental and somatic dysfunction of thoracic region: Secondary | ICD-10-CM | POA: Diagnosis not present

## 2023-02-06 DIAGNOSIS — M50322 Other cervical disc degeneration at C5-C6 level: Secondary | ICD-10-CM | POA: Diagnosis not present

## 2023-02-06 DIAGNOSIS — M9901 Segmental and somatic dysfunction of cervical region: Secondary | ICD-10-CM | POA: Diagnosis not present

## 2023-02-06 DIAGNOSIS — M542 Cervicalgia: Secondary | ICD-10-CM | POA: Diagnosis not present

## 2023-02-07 DIAGNOSIS — M9901 Segmental and somatic dysfunction of cervical region: Secondary | ICD-10-CM | POA: Diagnosis not present

## 2023-02-07 DIAGNOSIS — M542 Cervicalgia: Secondary | ICD-10-CM | POA: Diagnosis not present

## 2023-02-07 DIAGNOSIS — M50322 Other cervical disc degeneration at C5-C6 level: Secondary | ICD-10-CM | POA: Diagnosis not present

## 2023-02-07 DIAGNOSIS — M9902 Segmental and somatic dysfunction of thoracic region: Secondary | ICD-10-CM | POA: Diagnosis not present

## 2023-02-08 DIAGNOSIS — M50322 Other cervical disc degeneration at C5-C6 level: Secondary | ICD-10-CM | POA: Diagnosis not present

## 2023-02-08 DIAGNOSIS — M9901 Segmental and somatic dysfunction of cervical region: Secondary | ICD-10-CM | POA: Diagnosis not present

## 2023-02-08 DIAGNOSIS — M542 Cervicalgia: Secondary | ICD-10-CM | POA: Diagnosis not present

## 2023-02-08 DIAGNOSIS — M9902 Segmental and somatic dysfunction of thoracic region: Secondary | ICD-10-CM | POA: Diagnosis not present

## 2023-02-09 DIAGNOSIS — M50322 Other cervical disc degeneration at C5-C6 level: Secondary | ICD-10-CM | POA: Diagnosis not present

## 2023-02-09 DIAGNOSIS — M9901 Segmental and somatic dysfunction of cervical region: Secondary | ICD-10-CM | POA: Diagnosis not present

## 2023-02-09 DIAGNOSIS — M542 Cervicalgia: Secondary | ICD-10-CM | POA: Diagnosis not present

## 2023-02-09 DIAGNOSIS — M9902 Segmental and somatic dysfunction of thoracic region: Secondary | ICD-10-CM | POA: Diagnosis not present

## 2023-02-12 ENCOUNTER — Ambulatory Visit: Payer: 59 | Admitting: Internal Medicine

## 2023-02-12 ENCOUNTER — Encounter: Payer: Self-pay | Admitting: Internal Medicine

## 2023-02-12 VITALS — BP 132/84 | HR 72 | Temp 98.3°F | Resp 16 | Ht 72.0 in | Wt 240.4 lb

## 2023-02-12 DIAGNOSIS — M50322 Other cervical disc degeneration at C5-C6 level: Secondary | ICD-10-CM | POA: Diagnosis not present

## 2023-02-12 DIAGNOSIS — I10 Essential (primary) hypertension: Secondary | ICD-10-CM

## 2023-02-12 DIAGNOSIS — M9902 Segmental and somatic dysfunction of thoracic region: Secondary | ICD-10-CM | POA: Diagnosis not present

## 2023-02-12 DIAGNOSIS — Z6832 Body mass index (BMI) 32.0-32.9, adult: Secondary | ICD-10-CM

## 2023-02-12 DIAGNOSIS — M542 Cervicalgia: Secondary | ICD-10-CM | POA: Diagnosis not present

## 2023-02-12 DIAGNOSIS — M9901 Segmental and somatic dysfunction of cervical region: Secondary | ICD-10-CM | POA: Diagnosis not present

## 2023-02-12 DIAGNOSIS — R7989 Other specified abnormal findings of blood chemistry: Secondary | ICD-10-CM | POA: Diagnosis not present

## 2023-02-12 DIAGNOSIS — E669 Obesity, unspecified: Secondary | ICD-10-CM

## 2023-02-12 LAB — AST: AST: 19 U/L (ref 0–37)

## 2023-02-12 LAB — LIPID PANEL
Cholesterol: 176 mg/dL (ref 0–200)
HDL: 70.2 mg/dL (ref >39.00)
LDL Cholesterol: 96 mg/dL (ref 0–99)
NonHDL: 105.8
Total CHOL/HDL Ratio: 3
Triglycerides: 48 mg/dL (ref 0.0–149.0)
VLDL: 9.6 mg/dL (ref 0.0–40.0)

## 2023-02-12 LAB — HEMOGLOBIN A1C: Hgb A1c MFr Bld: 5.3 % (ref 4.6–6.5)

## 2023-02-12 LAB — ALT: ALT: 26 U/L (ref 0–53)

## 2023-02-12 NOTE — Patient Instructions (Addendum)
Very happy for you, you are doing great!  Vaccines I recommend: Covid booster- new this fall Flu shot this fall  Check the  blood pressure regularly Blood pressure goal:  between 110/65 and  135/85. If it is consistently higher or lower, let me know  I left metoprolol in your medication list, feel free to restart it if your blood pressure is high consistently   GO TO THE LAB : Get the blood work     GO TO THE FRONT DESK, PLEASE SCHEDULE YOUR APPOINTMENTS Come back for a physical exam in 6 months

## 2023-02-12 NOTE — Progress Notes (Signed)
   Subjective:    Patient ID: David Buckley, male    DOB: 04/17/1981, 42 y.o.   MRN: [id number]  DOS:  02/12/2023 Type of visit - description: f/u  Since the last visit is doing well. Has changed his lifestyle  in a positive way.  + Weight loss noted.  Not taking metoprolol, ambulatory BPs when checked look okay.   Wt Readings from Last 3 Encounters:  02/12/23 240 lb 6 oz (109 kg)  08/22/22 296 lb (134.3 kg)  12/30/21 281 lb 8 oz (127.7 kg)    Review of Systems See above   Past Medical History:  Diagnosis Date   ADD (attention deficit disorder)    Hypertension 08/03/2020   Pure hypercholesterolemia 08/03/2020   Tic disorder    Chronic    Past Surgical History:  Procedure Laterality Date   COLONOSCOPY  2005   HERNIA REPAIR Left ~ 2009   KNEE ARTHROSCOPY Right 2017   MRI     L5-S1 problem---recvd nerve root block, denied Sx   TYMPANOSTOMY TUBE PLACEMENT  2004   VASECTOMY  03/2018    Current Outpatient Medications  Medication Instructions   amphetamine-dextroamphetamine (ADDERALL XR) 10 MG 24 hr capsule Take 1 capsule by mouth once a day as needed.   amphetamine-dextroamphetamine (ADDERALL XR) 10 MG 24 hr capsule Take 1 capsule by mouth once a day as needed.   levocetirizine (XYZAL) 5 mg, Oral, Every evening   metoprolol succinate (TOPROL-XL) 50 mg, Oral, Daily, Take with or immediately following a meal.       Objective:   Physical Exam BP 132/84   Pulse 72   Temp 98.3 F (36.8 C) (Oral)   Resp 16   Ht 6' (1.829 m)   Wt 240 lb 6 oz (109 kg)   SpO2 98%   BMI 32.60 kg/m  General:   Well developed, NAD, BMI noted. HEENT:  Normocephalic . Face symmetric, atraumatic Lungs:  CTA B Normal respiratory effort, no intercostal retractions, no accessory muscle use. Heart: RRR,  no murmur.  Lower extremities: no pretibial edema bilaterally  Skin: Not pale. Not jaundice Neurologic:  alert & oriented X3.  Speech normal, gait appropriate for age and  unassisted Psych--  Cognition and judgment appear intact.  Cooperative with normal attention span and concentration.  Behavior appropriate. No anxious or depressed appearing.      Assessment     Assessment HTN, Tachycardia - Echo 12-2019: Normal. - saw cards 08-2020, BP goal 140/90 tachy cardia likely d/t ADD meds  ADD (Strattera intolerant) Tic disorder (d/t Cylert?) Back pain: 11/2014, saw Ortho, local injections, improved   PLAN: HTN, tachycardia:  stopped metoprolol, BP and heart rate today are very good.  BP satisfactory most likely due to weight loss.  Recommend to restart metoprolol if needed. Morbid obesity: Doing great, has changed his diet completely, he is exercising regularly.  Praised! We agreed to check a FLP and A1c. Increased LFTs: Check labs. ADD: RF when needed. Vaccine advice provided. RTC 6 months CPX

## 2023-02-12 NOTE — Assessment & Plan Note (Signed)
HTN, tachycardia:  stopped metoprolol, BP and heart rate today are very good.  BP satisfactory most likely due to weight loss.  Recommend to restart metoprolol if needed. Morbid obesity: Doing great, has changed his diet completely, he is exercising regularly.  Praised! We agreed to check a FLP and A1c. Increased LFTs: Check labs. ADD: RF when needed. Vaccine advice provided. RTC 6 months CPX

## 2023-02-13 DIAGNOSIS — M9902 Segmental and somatic dysfunction of thoracic region: Secondary | ICD-10-CM | POA: Diagnosis not present

## 2023-02-13 DIAGNOSIS — M542 Cervicalgia: Secondary | ICD-10-CM | POA: Diagnosis not present

## 2023-02-13 DIAGNOSIS — M9901 Segmental and somatic dysfunction of cervical region: Secondary | ICD-10-CM | POA: Diagnosis not present

## 2023-02-13 DIAGNOSIS — M50322 Other cervical disc degeneration at C5-C6 level: Secondary | ICD-10-CM | POA: Diagnosis not present

## 2023-02-14 DIAGNOSIS — M9902 Segmental and somatic dysfunction of thoracic region: Secondary | ICD-10-CM | POA: Diagnosis not present

## 2023-02-14 DIAGNOSIS — M50322 Other cervical disc degeneration at C5-C6 level: Secondary | ICD-10-CM | POA: Diagnosis not present

## 2023-02-14 DIAGNOSIS — M542 Cervicalgia: Secondary | ICD-10-CM | POA: Diagnosis not present

## 2023-02-14 DIAGNOSIS — M9901 Segmental and somatic dysfunction of cervical region: Secondary | ICD-10-CM | POA: Diagnosis not present

## 2023-02-19 DIAGNOSIS — M9901 Segmental and somatic dysfunction of cervical region: Secondary | ICD-10-CM | POA: Diagnosis not present

## 2023-02-19 DIAGNOSIS — M50322 Other cervical disc degeneration at C5-C6 level: Secondary | ICD-10-CM | POA: Diagnosis not present

## 2023-02-19 DIAGNOSIS — M542 Cervicalgia: Secondary | ICD-10-CM | POA: Diagnosis not present

## 2023-02-19 DIAGNOSIS — M9902 Segmental and somatic dysfunction of thoracic region: Secondary | ICD-10-CM | POA: Diagnosis not present

## 2023-02-20 ENCOUNTER — Ambulatory Visit: Payer: 59 | Admitting: Internal Medicine

## 2023-02-20 DIAGNOSIS — M9902 Segmental and somatic dysfunction of thoracic region: Secondary | ICD-10-CM | POA: Diagnosis not present

## 2023-02-20 DIAGNOSIS — M50322 Other cervical disc degeneration at C5-C6 level: Secondary | ICD-10-CM | POA: Diagnosis not present

## 2023-02-20 DIAGNOSIS — M542 Cervicalgia: Secondary | ICD-10-CM | POA: Diagnosis not present

## 2023-02-20 DIAGNOSIS — M9901 Segmental and somatic dysfunction of cervical region: Secondary | ICD-10-CM | POA: Diagnosis not present

## 2023-02-21 DIAGNOSIS — M9902 Segmental and somatic dysfunction of thoracic region: Secondary | ICD-10-CM | POA: Diagnosis not present

## 2023-02-21 DIAGNOSIS — M9901 Segmental and somatic dysfunction of cervical region: Secondary | ICD-10-CM | POA: Diagnosis not present

## 2023-02-21 DIAGNOSIS — M542 Cervicalgia: Secondary | ICD-10-CM | POA: Diagnosis not present

## 2023-02-21 DIAGNOSIS — M50322 Other cervical disc degeneration at C5-C6 level: Secondary | ICD-10-CM | POA: Diagnosis not present

## 2023-02-26 ENCOUNTER — Telehealth: Payer: Self-pay | Admitting: Internal Medicine

## 2023-02-26 ENCOUNTER — Other Ambulatory Visit (HOSPITAL_BASED_OUTPATIENT_CLINIC_OR_DEPARTMENT_OTHER): Payer: Self-pay

## 2023-02-26 MED ORDER — AMPHETAMINE-DEXTROAMPHET ER 10 MG PO CP24
10.0000 mg | ORAL_CAPSULE | Freq: Every day | ORAL | 0 refills | Status: DC | PRN
  Filled 2023-02-26 – 2023-04-17 (×2): qty 30, 30d supply, fill #0

## 2023-02-26 MED ORDER — AMPHETAMINE-DEXTROAMPHET ER 10 MG PO CP24
10.0000 mg | ORAL_CAPSULE | Freq: Every day | ORAL | 0 refills | Status: DC | PRN
  Filled 2023-02-26: qty 30, 30d supply, fill #0

## 2023-02-26 NOTE — Telephone Encounter (Signed)
Requesting: Adderall XR 10mg   Contract: 08/22/22 UDS: 08/22/22 Last Visit: 02/12/23 Next Visit: 08/28/23 Last Refill: 11/23/22 #30 and 0RF (x2)  Please Advise

## 2023-02-26 NOTE — Telephone Encounter (Signed)
Pdmp ok, rx sent  ? ?

## 2023-02-28 DIAGNOSIS — M9901 Segmental and somatic dysfunction of cervical region: Secondary | ICD-10-CM | POA: Diagnosis not present

## 2023-02-28 DIAGNOSIS — M50322 Other cervical disc degeneration at C5-C6 level: Secondary | ICD-10-CM | POA: Diagnosis not present

## 2023-02-28 DIAGNOSIS — M9902 Segmental and somatic dysfunction of thoracic region: Secondary | ICD-10-CM | POA: Diagnosis not present

## 2023-02-28 DIAGNOSIS — M542 Cervicalgia: Secondary | ICD-10-CM | POA: Diagnosis not present

## 2023-03-01 DIAGNOSIS — M542 Cervicalgia: Secondary | ICD-10-CM | POA: Diagnosis not present

## 2023-03-01 DIAGNOSIS — M50322 Other cervical disc degeneration at C5-C6 level: Secondary | ICD-10-CM | POA: Diagnosis not present

## 2023-03-01 DIAGNOSIS — M9901 Segmental and somatic dysfunction of cervical region: Secondary | ICD-10-CM | POA: Diagnosis not present

## 2023-03-01 DIAGNOSIS — M9902 Segmental and somatic dysfunction of thoracic region: Secondary | ICD-10-CM | POA: Diagnosis not present

## 2023-03-02 DIAGNOSIS — M9902 Segmental and somatic dysfunction of thoracic region: Secondary | ICD-10-CM | POA: Diagnosis not present

## 2023-03-02 DIAGNOSIS — M9901 Segmental and somatic dysfunction of cervical region: Secondary | ICD-10-CM | POA: Diagnosis not present

## 2023-03-02 DIAGNOSIS — M50322 Other cervical disc degeneration at C5-C6 level: Secondary | ICD-10-CM | POA: Diagnosis not present

## 2023-03-02 DIAGNOSIS — M542 Cervicalgia: Secondary | ICD-10-CM | POA: Diagnosis not present

## 2023-03-28 ENCOUNTER — Other Ambulatory Visit (HOSPITAL_BASED_OUTPATIENT_CLINIC_OR_DEPARTMENT_OTHER): Payer: Self-pay

## 2023-03-28 MED ORDER — FLULAVAL 0.5 ML IM SUSY
0.5000 mL | PREFILLED_SYRINGE | Freq: Once | INTRAMUSCULAR | 0 refills | Status: AC
  Filled 2023-03-28: qty 0.5, 1d supply, fill #0

## 2023-04-17 ENCOUNTER — Other Ambulatory Visit (HOSPITAL_BASED_OUTPATIENT_CLINIC_OR_DEPARTMENT_OTHER): Payer: Self-pay

## 2023-05-25 ENCOUNTER — Telehealth: Payer: Self-pay | Admitting: Internal Medicine

## 2023-05-25 ENCOUNTER — Other Ambulatory Visit (HOSPITAL_BASED_OUTPATIENT_CLINIC_OR_DEPARTMENT_OTHER): Payer: Self-pay

## 2023-05-25 MED ORDER — AMPHETAMINE-DEXTROAMPHET ER 10 MG PO CP24
10.0000 mg | ORAL_CAPSULE | Freq: Every day | ORAL | 0 refills | Status: DC | PRN
  Filled 2023-05-25: qty 30, 30d supply, fill #0

## 2023-05-25 NOTE — Telephone Encounter (Signed)
Requesting: Adderall XR 10mg   Contract: 08/22/22 UDS: 08/22/22 Last Visit: 02/12/23 Next Visit: 08/28/23 Last Refill: 02/26/23 #30 and 0RF (x2)  Please Advise

## 2023-05-25 NOTE — Telephone Encounter (Signed)
PDMP okay, Rx sent 

## 2023-06-20 ENCOUNTER — Other Ambulatory Visit (HOSPITAL_BASED_OUTPATIENT_CLINIC_OR_DEPARTMENT_OTHER): Payer: Self-pay

## 2023-06-20 ENCOUNTER — Telehealth: Payer: 59 | Admitting: Physician Assistant

## 2023-06-20 DIAGNOSIS — H109 Unspecified conjunctivitis: Secondary | ICD-10-CM | POA: Diagnosis not present

## 2023-06-20 MED ORDER — MOXIFLOXACIN HCL 0.5 % OP SOLN
1.0000 [drp] | Freq: Three times a day (TID) | OPHTHALMIC | 0 refills | Status: DC
  Filled 2023-06-20: qty 3, 20d supply, fill #0

## 2023-06-20 NOTE — Patient Instructions (Signed)
 David Buckley, thank you for joining Angelia Kelp, PA-C for today's virtual visit.  While this provider is not your primary care provider (PCP), if your PCP is located in our provider database this encounter information will be shared with them immediately following your visit.   A [facility] MyChart account gives you access to today's visit and all your visits, tests, and labs performed at [facility] " click here if you don't have a [facility] MyChart account or go to [email or web address]  Consent: (Patient) David Buckley provided verbal consent for this virtual visit at the beginning of the encounter.  Current Medications:  Current Outpatient Medications:    moxifloxacin  (VIGAMOX ) 0.5 % ophthalmic solution, Place 1 drop into the left eye 3 (three) times daily. For 5 days, Disp: 3 mL, Rfl: 0   amphetamine -dextroamphetamine  (ADDERALL  XR) 10 MG 24 hr capsule, Take 1 capsule by mouth once a day as needed., Disp: 30 capsule, Rfl: 0   amphetamine -dextroamphetamine  (ADDERALL  XR) 10 MG 24 hr capsule, Take 1 capsule by mouth once a day as needed., Disp: 30 capsule, Rfl: 0   metoprolol  succinate (TOPROL -XL) 50 MG 24 hr tablet, Take 1 tablet (50 mg total) by mouth daily. Take with or immediately following a meal. (Patient not taking: Reported on 02/12/2023), Disp: 90 tablet, Rfl: 1   Medications ordered in this encounter:  Meds ordered this encounter  Medications   moxifloxacin  (VIGAMOX ) 0.5 % ophthalmic solution    Sig: Place 1 drop into the left eye 3 (three) times daily. For 5 days    Dispense:  3 mL    Refill:  0    Supervising Provider:   Corine Dice [[id number]]     *If you need refills on other medications prior to your next appointment, please contact your pharmacy*  Follow-Up: Call back or seek an in-person evaluation if the symptoms worsen or if the condition fails to improve as anticipated.  [facility] [phone]  Other  Instructions Bacterial Conjunctivitis, Adult Bacterial conjunctivitis is an infection of the clear membrane that covers the white part of the eye and the inner surface of the eyelid (conjunctiva). When the blood vessels in the conjunctiva become inflamed, the eye becomes red or pink. The eye often feels irritated or itchy. Bacterial conjunctivitis spreads easily from person to person (is contagious). It also spreads easily from one eye to the other eye. What are the causes? This condition is caused by bacteria. You may get the infection if you come into close contact with: A person who is infected with the bacteria. Items that are contaminated with the bacteria, such as a face towel, contact lens solution, or eye makeup. What increases the risk? You are more likely to develop this condition if: You are exposed to other people who have the infection. You wear contact lenses. You have a sinus infection. You have had a recent eye injury or surgery. You have a weak body defense system (immune system). You have a medical condition that causes dry eyes. What are the signs or symptoms? Symptoms of this condition include: Thick, yellowish discharge from the eye. This may turn into a crust on the eyelid overnight and cause your eyelids to stick together. Tearing or watery eyes. Itchy eyes. Burning feeling in your eyes. Eye redness. Swollen eyelids. Blurred vision. How is this diagnosed? This condition is diagnosed based on your symptoms and medical history. Your health care provider may also take a sample of discharge  from your eye to find the cause of your infection. How is this treated? This condition may be treated with: Antibiotic eye drops or ointment to clear the infection more quickly and prevent the spread of infection to others. Antibiotic medicines taken by mouth (orally) to treat infections that do not respond to drops or ointments or that last longer than 10 days. Cool, wet cloths  (cool compresses) placed on the eyes. Artificial tears applied 2-6 times a day. Follow these instructions at home: Medicines Take or apply your antibiotic medicine as told by your health care provider. Do not stop using the antibiotic, even if your condition improves, unless directed by your health care provider. Take or apply over-the-counter and prescription medicines only as told by your health care provider. Be very careful to avoid touching the edge of your eyelid with the eye-drop bottle or the ointment tube when you apply medicines to the affected eye. This will keep you from spreading the infection to your other eye or to other people. Managing discomfort Gently wipe away any drainage from your eye with a warm, wet washcloth or a cotton ball. Apply a clean, cool compress to your eye for 10-20 minutes, 3-4 times a day. General instructions Do not wear contact lenses until the inflammation is gone and your health care provider says it is safe to wear them again. Ask your health care provider how to sterilize or replace your contact lenses before you use them again. Wear glasses until you can resume wearing contact lenses. Avoid wearing eye makeup until the inflammation is gone. Throw away any old eye cosmetics that may be contaminated. Change or wash your pillowcase every day. Do not share towels or washcloths. This may spread the infection. Wash your hands often with soap and water for at least 20 seconds and especially before touching your face or eyes. Use paper towels to dry your hands. Avoid touching or rubbing your eyes. Do not drive or use heavy machinery if your vision is blurred. Contact a health care provider if: You have a fever. Your symptoms do not get better after 10 days. Get help right away if: You have a fever and your symptoms suddenly get worse. You have severe pain when you move your eye. You have facial pain, redness, or swelling. You have a sudden loss of  vision. Summary Bacterial conjunctivitis is an infection of the clear membrane that covers the white part of the eye and the inner surface of the eyelid (conjunctiva). Bacterial conjunctivitis spreads easily from eye to eye and from person to person (is contagious). Wash your hands often with soap and water for at least 20 seconds and especially before touching your face or eyes. Use paper towels to dry your hands. Take or apply your antibiotic medicine as told by your health care provider. Do not stop using the antibiotic even if your condition improves. Contact a health care provider if you have a fever or if your symptoms do not get better after 10 days. Get help right away if you have a sudden loss of vision. This information is not intended to replace advice given to you by your health care provider. Make sure you discuss any questions you have with your health care provider. Document Revised: 09/01/2020 Document Reviewed: 09/01/2020 Elsevier Patient Education  2024 Elsevier Inc.    If you have been instructed to have an in-person evaluation today at a local Urgent Care facility, please use the link below. It will take you to  a list of all of our available [facility], including address, phone number and hours of operation. Please do not delay care.  [facility]  If you or a family member do not have a primary care provider, use the link below to schedule a visit and establish care. When you choose a [facility] primary care physician or advanced practice provider, you gain a long-term partner in health. Find a Primary Care Provider  Learn more about [facility]'s in-office and virtual care options: [facility] - Get Care Now

## 2023-06-20 NOTE — Progress Notes (Signed)
 Virtual Visit Consent   EDRAS STOLTE, you are scheduled for a virtual visit with a [facility] provider today. Just as with appointments in the office, your consent must be obtained to participate. Your consent will be active for this visit and any virtual visit you may have with one of our providers in the next 365 days. If you have a MyChart account, a copy of this consent can be sent to you electronically.  As this is a virtual visit, video technology does not allow for your provider to perform a traditional examination. This may limit your provider's ability to fully assess your condition. If your provider identifies any concerns that need to be evaluated in person or the need to arrange testing (such as labs, EKG, etc.), we will make arrangements to do so. Although advances in technology are sophisticated, we cannot ensure that it will always work on either your end or our end. If the connection with a video visit is poor, the visit may have to be switched to a telephone visit. With either a video or telephone visit, we are not always able to ensure that we have a secure connection.  By engaging in this virtual visit, you consent to the provision of healthcare and authorize for your insurance to be billed (if applicable) for the services provided during this visit. Depending on your insurance coverage, you may receive a charge related to this service.  I need to obtain your verbal consent now. Are you willing to proceed with your visit today? [name] has provided verbal consent on 06/20/2023 for a virtual visit (video or telephone). Angelia Kelp, PA-C  Date: 06/20/2023 4:09 PM  Virtual Visit via Video Note   I, Angelia Kelp, connected with  David Buckley  ([id number], 12/02/1980) on 06/20/23 at  4:00 PM EST by a video-enabled telemedicine application and verified that I am speaking with the correct person using two identifiers.  Location: Patient: Virtual Visit  Location Patient: Mobile Provider: Virtual Visit Location Provider: Home Office   I discussed the limitations of evaluation and management by telemedicine and the availability of in person appointments. The patient expressed understanding and agreed to proceed.    History of Present Illness: David Buckley is a 43 y.o. who identifies as a male who was assigned male at birth, and is being seen today for possible pink eye.  HPI: Conjunctivitis  The current episode started today. The onset was sudden. The problem occurs continuously. The problem has been unchanged. The problem is mild. Nothing relieves the symptoms. Nothing aggravates the symptoms. Associated symptoms include headaches, rhinorrhea (on right side; very mild) and eye redness. Pertinent negatives include no fever, no decreased vision, no double vision, no eye itching, no photophobia, no congestion, no sore throat, no cough, no URI, no eye discharge and no eye pain. The eye pain is mild. The left eye is affected. The eye pain is not associated with movement. The eyelid exhibits no abnormality.   Exposed to bacterial conjunctivitis from Aunt that is watching children.   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 01/20/2016   Annual physical exam 06/03/2014   Obesity 01/24/2011   TIC DISORDER, UNSPECIFIED 04/08/2007   Attention deficit disorder 11/08/2006    Allergies: No Known Allergies Medications:  Current Outpatient Medications:    moxifloxacin  (VIGAMOX ) 0.5 %  ophthalmic solution, Place 1 drop into the left eye 3 (three) times daily. For 5 days, Disp: 3 mL, Rfl: 0   amphetamine -dextroamphetamine  (ADDERALL  XR) 10 MG 24 hr capsule, Take 1 capsule by mouth once a day as needed., Disp: 30 capsule, Rfl: 0   amphetamine -dextroamphetamine  (ADDERALL  XR) 10 MG 24 hr  capsule, Take 1 capsule by mouth once a day as needed., Disp: 30 capsule, Rfl: 0   metoprolol  succinate (TOPROL -XL) 50 MG 24 hr tablet, Take 1 tablet (50 mg total) by mouth daily. Take with or immediately following a meal. (Patient not taking: Reported on 02/12/2023), Disp: 90 tablet, Rfl: 1  Observations/Objective: Patient is well-developed, well-nourished in no acute distress.  Resting comfortably at home.  Head is normocephalic, atraumatic.  No labored breathing.  Speech is clear and coherent with logical content.  Patient is alert and oriented at baseline.  Left eye mildly injected. EOMI. Pupils round and equal.  Assessment and Plan: 1. Bacterial conjunctivitis of left eye (Primary) - moxifloxacin  (VIGAMOX ) 0.5 % ophthalmic solution; Place 1 drop into the left eye 3 (three) times daily. For 5 days  Dispense: 3 mL; Refill: 0  - Suspect bacterial conjunctivitis - Vigamox  prescribed - Warm compresses - Good hand hygiene - Seek in person evaluation if symptoms worsen or fail to improve   Follow Up Instructions: I discussed the assessment and treatment plan with the patient. The patient was provided an opportunity to ask questions and all were answered. The patient agreed with the plan and demonstrated an understanding of the instructions.  A copy of instructions were sent to the patient via MyChart unless otherwise noted below.    The patient was advised to call back or seek an in-person evaluation if the symptoms worsen or if the condition fails to improve as anticipated.    Angelia Kelp, PA-C

## 2023-08-09 ENCOUNTER — Other Ambulatory Visit (HOSPITAL_BASED_OUTPATIENT_CLINIC_OR_DEPARTMENT_OTHER): Payer: Self-pay

## 2023-08-09 ENCOUNTER — Telehealth: Payer: Self-pay | Admitting: Internal Medicine

## 2023-08-09 MED ORDER — AMPHETAMINE-DEXTROAMPHET ER 10 MG PO CP24
10.0000 mg | ORAL_CAPSULE | Freq: Every day | ORAL | 0 refills | Status: DC | PRN
  Filled 2023-08-09: qty 30, 30d supply, fill #0

## 2023-08-09 MED ORDER — AMPHETAMINE-DEXTROAMPHET ER 10 MG PO CP24
10.0000 mg | ORAL_CAPSULE | Freq: Every day | ORAL | 0 refills | Status: DC | PRN
  Filled 2023-08-09 – 2023-09-20 (×2): qty 30, 30d supply, fill #0

## 2023-08-09 NOTE — Telephone Encounter (Signed)
PDMP review, prescription sent 

## 2023-08-09 NOTE — Telephone Encounter (Signed)
 Requesting: Adderall XR 10mg   Contract: 08/22/22 UDS: 08/22/22 Last Visit: 02/12/23 Next Visit: 08/28/23 Last Refill: 05/25/23 #30 and 0RF (X2)  Please Advise

## 2023-08-28 ENCOUNTER — Ambulatory Visit (INDEPENDENT_AMBULATORY_CARE_PROVIDER_SITE_OTHER): Payer: 59 | Admitting: Internal Medicine

## 2023-08-28 ENCOUNTER — Encounter: Payer: Self-pay | Admitting: Internal Medicine

## 2023-08-28 VITALS — BP 130/68 | HR 84 | Temp 97.9°F | Resp 16 | Ht 72.0 in | Wt 243.5 lb

## 2023-08-28 DIAGNOSIS — Z79899 Other long term (current) drug therapy: Secondary | ICD-10-CM

## 2023-08-28 DIAGNOSIS — I10 Essential (primary) hypertension: Secondary | ICD-10-CM

## 2023-08-28 DIAGNOSIS — F988 Other specified behavioral and emotional disorders with onset usually occurring in childhood and adolescence: Secondary | ICD-10-CM

## 2023-08-28 DIAGNOSIS — E7800 Pure hypercholesterolemia, unspecified: Secondary | ICD-10-CM | POA: Diagnosis not present

## 2023-08-28 DIAGNOSIS — Z0000 Encounter for general adult medical examination without abnormal findings: Secondary | ICD-10-CM | POA: Diagnosis not present

## 2023-08-28 LAB — CBC WITH DIFFERENTIAL/PLATELET
Basophils Absolute: 0 10*3/uL (ref 0.0–0.1)
Basophils Relative: 0.6 % (ref 0.0–3.0)
Eosinophils Absolute: 0 10*3/uL (ref 0.0–0.7)
Eosinophils Relative: 1.4 % (ref 0.0–5.0)
HCT: 42.7 % (ref 39.0–52.0)
Hemoglobin: 14.5 g/dL (ref 13.0–17.0)
Lymphocytes Relative: 42.1 % (ref 12.0–46.0)
Lymphs Abs: 1.5 10*3/uL (ref 0.7–4.0)
MCHC: 34 g/dL (ref 30.0–36.0)
MCV: 91.5 fl (ref 78.0–100.0)
Monocytes Absolute: 0.3 10*3/uL (ref 0.1–1.0)
Monocytes Relative: 9.6 % (ref 3.0–12.0)
Neutro Abs: 1.6 10*3/uL (ref 1.4–7.7)
Neutrophils Relative %: 46.3 % (ref 43.0–77.0)
Platelets: 250 10*3/uL (ref 150.0–400.0)
RBC: 4.67 Mil/uL (ref 4.22–5.81)
RDW: 12.7 % (ref 11.5–15.5)
WBC: 3.5 10*3/uL — ABNORMAL LOW (ref 4.0–10.5)

## 2023-08-28 LAB — LIPID PANEL
Cholesterol: 176 mg/dL (ref 0–200)
HDL: 46.4 mg/dL (ref >39.00)
LDL Cholesterol: 106 mg/dL — ABNORMAL HIGH (ref 0–99)
NonHDL: 130.06
Total CHOL/HDL Ratio: 4
Triglycerides: 121 mg/dL (ref 0.0–149.0)
VLDL: 24.2 mg/dL (ref 0.0–40.0)

## 2023-08-28 LAB — COMPREHENSIVE METABOLIC PANEL
ALT: 23 U/L (ref 0–53)
AST: 19 U/L (ref 0–37)
Albumin: 4.8 g/dL (ref 3.5–5.2)
Alkaline Phosphatase: 45 U/L (ref 39–117)
BUN: 13 mg/dL (ref 6–23)
CO2: 30 meq/L (ref 19–32)
Calcium: 9.6 mg/dL (ref 8.4–10.5)
Chloride: 103 meq/L (ref 96–112)
Creatinine, Ser: 0.94 mg/dL (ref 0.40–1.50)
GFR: 98.87 mL/min (ref >60.00)
Glucose, Bld: 93 mg/dL (ref 70–99)
Potassium: 4.2 meq/L (ref 3.5–5.1)
Sodium: 140 meq/L (ref 135–145)
Total Bilirubin: 0.6 mg/dL (ref 0.2–1.2)
Total Protein: 7.2 g/dL (ref 6.0–8.3)

## 2023-08-28 NOTE — Progress Notes (Signed)
 Subjective:    Patient ID: David Buckley, male    DOB: 11-16-1980, 43 y.o.   MRN: [id number]  DOS:  08/28/2023 Type of visit - description: CPX  Here for CPX.  Feeling good.  Wt Readings from Last 3 Encounters:  08/28/23 243 lb 8 oz (110.5 kg)  02/12/23 240 lb 6 oz (109 kg)  08/22/22 296 lb (134.3 kg)   Review of Systems   A 14 point review of systems is negative    Past Medical History:  Diagnosis Date   ADD (attention deficit disorder)    Hypertension 08/03/2020   Pure hypercholesterolemia 08/03/2020   Tic disorder    Chronic    Past Surgical History:  Procedure Laterality Date   COLONOSCOPY  2005   HERNIA REPAIR Left ~ 2009   KNEE ARTHROSCOPY Right 2017   MRI     L5-S1 problem---recvd nerve root block, denied Sx   TYMPANOSTOMY TUBE PLACEMENT  2004   VASECTOMY  03/2018   Social History   Socioeconomic History   Marital status: Married    Spouse name: Not on file   Number of children: 2   Years of education: Not on file   Highest education level: Not on file  Occupational History   Occupation: commercial real estate  appraisal  Tobacco Use   Smoking status: Never   Smokeless tobacco: Never  Vaping Use   Vaping status: Never Used  Substance and Sexual Activity   Alcohol use: Yes    Comment: social   Drug use: No   Sexual activity: Not on file  Other Topics Concern   Not on file  Social History Narrative   Household -- pt, wife, 2 daughters  (2014, 2013)            Social Drivers of Corporate investment banker Strain: Patient Declined (02/10/2023)   Overall Financial Resource Strain (CARDIA)    Difficulty of Paying Living Expenses: Patient declined  Food Insecurity: Patient Declined (02/10/2023)   Hunger Vital Sign    Worried About Running Out of Food in the Last Year: Patient declined    Ran Out of Food in the Last Year: Patient declined  Transportation Needs: Patient Declined (02/10/2023)   PRAPARE - Administrator, Civil Service  (Medical): Patient declined    Lack of Transportation (Non-Medical): Patient declined  Physical Activity: Sufficiently Active (02/10/2023)   Exercise Vital Sign    Days of Exercise per Week: 6 days    Minutes of Exercise per Session: 30 min  Stress: No Stress Concern Present (02/10/2023)   [employer] of Occupational Health - Occupational Stress Questionnaire    Feeling of Stress : Not at all  Social Connections: Unknown (02/10/2023)   Social Connection and Isolation Panel [NHANES]    Frequency of Communication with Friends and Family: More than three times a week    Frequency of Social Gatherings with Friends and Family: Patient declined    Attends Religious Services: Patient declined    Database administrator or Organizations: No    Attends Engineer, structural: Not on file    Marital Status: Patient declined  Catering manager Violence: Not on file     Current Outpatient Medications  Medication Instructions   amphetamine-dextroamphetamine (ADDERALL XR) 10 MG 24 hr capsule Take 1 capsule by mouth once a day as needed.   amphetamine-dextroamphetamine (ADDERALL XR) 10 MG 24 hr capsule Take 1 capsule by mouth once a day as needed.  Objective:   Physical Exam BP 130/68   Pulse 84   Temp 97.9 F (36.6 C) (Oral)   Resp 16   Ht 6' (1.829 m)   Wt 243 lb 8 oz (110.5 kg)   SpO2 99%   BMI 33.02 kg/m  General: Well developed, NAD, BMI noted Neck: Palpable, nontender thyroid gland.  Does not seem to be enlarged. HEENT:  Normocephalic . Face symmetric, atraumatic Lungs:  CTA B Normal respiratory effort, no intercostal retractions, no accessory muscle use. Heart: RRR,  no murmur.  Abdomen:  Not distended, soft, non-tender. No rebound or rigidity.   Lower extremities: no pretibial edema bilaterally  Skin: Exposed areas without rash. Not pale. Not jaundice Neurologic:  alert & oriented X3.  Speech normal, gait appropriate for age and unassisted Strength symmetric  and appropriate for age.  Psych: Cognition and judgment appear intact.  Cooperative with normal attention span and concentration.  Behavior appropriate. No anxious or depressed appearing.     Assessment     Assessment HTN, Tachycardia - Echo 12-2019: Normal. 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 - had a Flu shot, declines covid vax. - CCS: cscope 11-2021, next per GI - Labs: CMP FLP CBC -Diet and exercise: Still exercising regularly.  Diet has not been as healthy as before but plans to eat healthier. We also address the following HTN, history of tachycardia: Off metoprolol, BP today satisfactory, no tachycardia.  Most likely losing weight has helped.  Recommend to check BPs regularly. Morbid obesity: Doing great despite the fact that his diet is not as healthy as before, encouraged to go to a more strict diet. ADD: Controlled, he wonders if he still needs Adderall because it is such a low dose.  Okay to use it as needed, reassess periodically RTC 6 months, mostly to check his weight.

## 2023-08-28 NOTE — Assessment & Plan Note (Signed)
 Here for CPX We also address the following HTN, history of tachycardia: Off metoprolol, BP today satisfactory, no tachycardia.  Most likely losing weight has helped.  Recommend to check BPs regularly. Morbid obesity: Doing great despite the fact that his diet is not as healthy as before, encouraged to go to a more strict diet. ADD: Controlled, he wonders if he still needs Adderall because it is such a low dose.  Okay to use it as needed, reassess periodically RTC 6 months, mostly to check his weight.

## 2023-08-28 NOTE — Patient Instructions (Addendum)
  Check the  blood pressure regularly Blood pressure goal:  between 110/65 and  135/85. If it is consistently higher or lower, let me know     GO TO THE LAB : Get the blood work     Please go to the front desk: Arrange for a follow-up in 6 months

## 2023-08-28 NOTE — Assessment & Plan Note (Signed)
 Here for CPX - Td  2020. - had a Flu shot, declines covid vax. - CCS: cscope 11-2021, next per GI - Labs: CMP FLP CBC -Diet and exercise: Still exercising regularly.  Diet has not been as healthy as before but plans to eat healthier.

## 2023-08-29 ENCOUNTER — Encounter: Payer: Self-pay | Admitting: Internal Medicine

## 2023-08-30 LAB — DRUG MONITORING PANEL 375977 , URINE
Alcohol Metabolites: NEGATIVE ng/mL (ref <500)
Amphetamines: NEGATIVE ng/mL (ref <500)
Barbiturates: NEGATIVE ng/mL (ref <300)
Benzodiazepines: NEGATIVE ng/mL (ref <100)
Cocaine Metabolite: NEGATIVE ng/mL (ref <150)
Desmethyltramadol: NEGATIVE ng/mL (ref <100)
Marijuana Metabolite: NEGATIVE ng/mL (ref <20)
Marijuana Metabolite: NEGATIVE ng/mL (ref <5)
Opiates: NEGATIVE ng/mL (ref <100)
Oxycodone: NEGATIVE ng/mL (ref <100)
Tramadol: NEGATIVE ng/mL (ref <100)

## 2023-08-30 LAB — DM TEMPLATE

## 2023-09-20 ENCOUNTER — Other Ambulatory Visit: Payer: Self-pay

## 2023-09-20 ENCOUNTER — Other Ambulatory Visit (HOSPITAL_BASED_OUTPATIENT_CLINIC_OR_DEPARTMENT_OTHER): Payer: Self-pay

## 2023-10-21 DIAGNOSIS — S82002A Unspecified fracture of left patella, initial encounter for closed fracture: Secondary | ICD-10-CM | POA: Diagnosis not present

## 2023-10-21 DIAGNOSIS — S46911A Strain of unspecified muscle, fascia and tendon at shoulder and upper arm level, right arm, initial encounter: Secondary | ICD-10-CM | POA: Diagnosis not present

## 2023-11-02 ENCOUNTER — Other Ambulatory Visit (HOSPITAL_BASED_OUTPATIENT_CLINIC_OR_DEPARTMENT_OTHER): Payer: Self-pay

## 2023-11-02 ENCOUNTER — Telehealth: Payer: Self-pay | Admitting: Internal Medicine

## 2023-11-02 MED ORDER — AMPHETAMINE-DEXTROAMPHET ER 10 MG PO CP24
10.0000 mg | ORAL_CAPSULE | Freq: Every day | ORAL | 0 refills | Status: DC | PRN
  Filled 2023-11-02: qty 30, 30d supply, fill #0

## 2023-11-02 NOTE — Telephone Encounter (Signed)
 Name of Medication: amphetamine -dextroamphetamine  (ADDERALL ) Name of Pharmacy: [facility] Last Fill or Written Date and Quantity: 08/09/2023 30cap 0 refills Last Office Visit and Type: 08/28/23 Annual Next Office Visit and Type: 02/12/24 Last Controlled Substance Agreement Date: none Last UDS: 08/28/23

## 2023-11-02 NOTE — Telephone Encounter (Signed)
 PDMP okay, Rx sent

## 2023-11-02 NOTE — Telephone Encounter (Signed)
 Requesting:Adderall  XR 10mg  Contract: 08/25/22 UDS: 08/28/23 Last Visit: 08/28/23 Next Visit: 02/12/24  Last Refill: 08/09/23 #30 and 0RF (x2)  Please Advise

## 2023-11-13 ENCOUNTER — Other Ambulatory Visit (HOSPITAL_BASED_OUTPATIENT_CLINIC_OR_DEPARTMENT_OTHER): Payer: Self-pay

## 2023-11-13 DIAGNOSIS — S82002D Unspecified fracture of left patella, subsequent encounter for closed fracture with routine healing: Secondary | ICD-10-CM | POA: Diagnosis not present

## 2023-11-13 MED ORDER — CELECOXIB 200 MG PO CAPS
200.0000 mg | ORAL_CAPSULE | Freq: Every day | ORAL | 1 refills | Status: AC
  Filled 2023-11-13: qty 30, 30d supply, fill #0

## 2023-12-04 DIAGNOSIS — S82002D Unspecified fracture of left patella, subsequent encounter for closed fracture with routine healing: Secondary | ICD-10-CM | POA: Diagnosis not present

## 2023-12-25 ENCOUNTER — Other Ambulatory Visit: Payer: Self-pay | Admitting: Internal Medicine

## 2023-12-25 ENCOUNTER — Other Ambulatory Visit (HOSPITAL_BASED_OUTPATIENT_CLINIC_OR_DEPARTMENT_OTHER): Payer: Self-pay

## 2023-12-25 MED ORDER — AMPHETAMINE-DEXTROAMPHET ER 10 MG PO CP24
10.0000 mg | ORAL_CAPSULE | Freq: Every day | ORAL | 0 refills | Status: DC | PRN
  Filled 2023-12-25: qty 30, 30d supply, fill #0

## 2023-12-25 NOTE — Telephone Encounter (Signed)
 Requesting: Adderall  XR 10mg   Contract: 08/25/22 UDS: 08/28/23 Last Visit: 08/28/23 Next Visit: 02/12/24 Last Refill: see med list   Please Advise

## 2023-12-25 NOTE — Telephone Encounter (Signed)
 PDMP okay, Rx sent

## 2024-02-11 ENCOUNTER — Other Ambulatory Visit: Payer: Self-pay

## 2024-02-11 ENCOUNTER — Other Ambulatory Visit (HOSPITAL_BASED_OUTPATIENT_CLINIC_OR_DEPARTMENT_OTHER): Payer: Self-pay

## 2024-02-11 ENCOUNTER — Other Ambulatory Visit: Payer: Self-pay | Admitting: Internal Medicine

## 2024-02-11 MED ORDER — AMPHETAMINE-DEXTROAMPHET ER 10 MG PO CP24
10.0000 mg | ORAL_CAPSULE | Freq: Every day | ORAL | 0 refills | Status: DC | PRN
  Filled 2024-02-11 – 2024-03-20 (×2): qty 30, 30d supply, fill #0

## 2024-02-11 MED ORDER — AMPHETAMINE-DEXTROAMPHET ER 10 MG PO CP24
10.0000 mg | ORAL_CAPSULE | Freq: Every day | ORAL | 0 refills | Status: DC | PRN
  Filled 2024-02-11 (×2): qty 30, 30d supply, fill #0

## 2024-02-11 NOTE — Telephone Encounter (Signed)
 Requesting: Adderall  XR 10mg   Contract: 08/25/22 UDS: 08/28/23 Last Visit: 08/28/23 Next Visit: None Last Refill: see med list   Please Advise

## 2024-02-12 ENCOUNTER — Ambulatory Visit: Admitting: Internal Medicine

## 2024-03-20 ENCOUNTER — Other Ambulatory Visit (HOSPITAL_BASED_OUTPATIENT_CLINIC_OR_DEPARTMENT_OTHER): Payer: Self-pay

## 2024-03-20 ENCOUNTER — Other Ambulatory Visit: Payer: Self-pay

## 2024-03-21 ENCOUNTER — Other Ambulatory Visit (HOSPITAL_BASED_OUTPATIENT_CLINIC_OR_DEPARTMENT_OTHER): Payer: Self-pay

## 2024-03-21 MED ORDER — FLUZONE 0.5 ML IM SUSY
0.5000 mL | PREFILLED_SYRINGE | Freq: Once | INTRAMUSCULAR | 0 refills | Status: AC
  Filled 2024-03-21: qty 0.5, 1d supply, fill #0

## 2024-05-19 ENCOUNTER — Other Ambulatory Visit: Payer: Self-pay | Admitting: Family

## 2024-05-19 MED ORDER — AMPHETAMINE-DEXTROAMPHET ER 10 MG PO CP24
10.0000 mg | ORAL_CAPSULE | Freq: Every day | ORAL | 0 refills | Status: DC | PRN
  Filled 2024-05-19: qty 30, 30d supply, fill #0

## 2024-05-19 NOTE — Telephone Encounter (Signed)
 Requesting: Adderall  XR 10mg   Contract:08/25/22 UDS:08/28/23 Last Visit: 08/28/23 Next Visit:07/18/24 Last Refill: 02/11/24 #30 and 0RF (x2)   Please Advise

## 2024-05-20 ENCOUNTER — Other Ambulatory Visit (HOSPITAL_BASED_OUTPATIENT_CLINIC_OR_DEPARTMENT_OTHER): Payer: Self-pay

## 2024-07-08 ENCOUNTER — Other Ambulatory Visit (HOSPITAL_BASED_OUTPATIENT_CLINIC_OR_DEPARTMENT_OTHER): Payer: Self-pay

## 2024-07-08 ENCOUNTER — Telehealth: Payer: Self-pay | Admitting: Family

## 2024-07-08 MED ORDER — AMPHETAMINE-DEXTROAMPHET ER 10 MG PO CP24
10.0000 mg | ORAL_CAPSULE | Freq: Every day | ORAL | 0 refills | Status: AC | PRN
  Filled 2024-07-08: qty 30, 30d supply, fill #0

## 2024-07-08 NOTE — Telephone Encounter (Signed)
 PDMP okay, Rx sent

## 2024-07-18 ENCOUNTER — Ambulatory Visit: Admitting: Internal Medicine
